# Patient Record
Sex: Female | Born: 1955 | Race: Black or African American | Hispanic: No | Marital: Married | Smoking: Never smoker
Health system: Southern US, Community
[De-identification: ages and names within clinical notes are randomized; demographics above are authoritative.]

## PROBLEM LIST (undated history)

## (undated) DIAGNOSIS — I10 Essential (primary) hypertension: Secondary | ICD-10-CM

## (undated) DIAGNOSIS — J309 Allergic rhinitis, unspecified: Secondary | ICD-10-CM

## (undated) DIAGNOSIS — R739 Hyperglycemia, unspecified: Secondary | ICD-10-CM

## (undated) DIAGNOSIS — M1990 Unspecified osteoarthritis, unspecified site: Secondary | ICD-10-CM

## (undated) DIAGNOSIS — B372 Candidiasis of skin and nail: Secondary | ICD-10-CM

## (undated) DIAGNOSIS — J069 Acute upper respiratory infection, unspecified: Secondary | ICD-10-CM

## (undated) DIAGNOSIS — I8390 Asymptomatic varicose veins of unspecified lower extremity: Secondary | ICD-10-CM

## (undated) DIAGNOSIS — J45909 Unspecified asthma, uncomplicated: Secondary | ICD-10-CM

## (undated) DIAGNOSIS — R198 Other specified symptoms and signs involving the digestive system and abdomen: Secondary | ICD-10-CM

## (undated) DIAGNOSIS — E785 Hyperlipidemia, unspecified: Secondary | ICD-10-CM

## (undated) DIAGNOSIS — S8991XA Unspecified injury of right lower leg, initial encounter: Secondary | ICD-10-CM

## (undated) DIAGNOSIS — E559 Vitamin D deficiency, unspecified: Secondary | ICD-10-CM

## (undated) DIAGNOSIS — M109 Gout, unspecified: Secondary | ICD-10-CM

## (undated) HISTORY — DX: Unspecified osteoarthritis, unspecified site: M19.90

## (undated) HISTORY — DX: Unspecified injury of right lower leg, initial encounter: S89.91XA

## (undated) HISTORY — DX: Allergic rhinitis, unspecified: J30.9

## (undated) HISTORY — DX: Hyperlipidemia, unspecified: E78.5

## (undated) HISTORY — DX: Asymptomatic varicose veins of unspecified lower extremity: I83.90

## (undated) HISTORY — DX: Acute upper respiratory infection, unspecified: J06.9

## (undated) HISTORY — DX: Gout, unspecified: M10.9

## (undated) HISTORY — DX: Other specified symptoms and signs involving the digestive system and abdomen: R19.8

## (undated) HISTORY — DX: Unspecified asthma, uncomplicated: J45.909

## (undated) HISTORY — DX: Vitamin D deficiency, unspecified: E55.9

## (undated) HISTORY — DX: Hyperglycemia, unspecified: R73.9

## (undated) HISTORY — DX: Candidiasis of skin and nail: B37.2

---

## 1988-10-19 HISTORY — PX: TUBAL LIGATION: SHX77

## 2016-07-14 LAB — BASIC METABOLIC PANEL
BUN: 12 (ref 4–21)
Creatinine: 0.9 (ref <=1.1)
Glucose: 98
Potassium: 5 (ref 3.4–5.3)
Sodium: 144 (ref 137–147)

## 2016-07-14 LAB — VITAMIN B12: Vitamin B-12: 275

## 2016-07-14 LAB — CBC AND DIFFERENTIAL: WBC: 4.3

## 2016-07-14 LAB — VITAMIN D 25 HYDROXY (VIT D DEFICIENCY, FRACTURES): Vit D, 25-Hydroxy: 32.7

## 2016-07-14 LAB — TSH: TSH: 4.11 (ref 0.41–5.90)

## 2017-04-08 LAB — VITAMIN D 25 HYDROXY (VIT D DEFICIENCY, FRACTURES): Vit D, 25-Hydroxy: 37.5

## 2017-04-08 LAB — VITAMIN B12: Vitamin B-12: 575

## 2018-05-24 LAB — VITAMIN D 25 HYDROXY (VIT D DEFICIENCY, FRACTURES): Vit D, 25-Hydroxy: 33.4

## 2019-01-02 LAB — HM MAMMOGRAPHY

## 2019-02-06 ENCOUNTER — Telehealth: Payer: Self-pay | Admitting: Family

## 2019-02-06 DIAGNOSIS — R52 Pain, unspecified: Secondary | ICD-10-CM

## 2019-02-06 DIAGNOSIS — H9209 Otalgia, unspecified ear: Secondary | ICD-10-CM

## 2019-02-06 DIAGNOSIS — R42 Dizziness and giddiness: Secondary | ICD-10-CM

## 2019-02-06 NOTE — Progress Notes (Signed)
Based on what you shared with me, I feel your condition warrants further evaluation and I recommend that you be seen for a face to face office visit.  Given your symptoms of dizziness, body aches, and nausea it would be best if you were seen face to face to be evaluated.    NOTE: If you entered your credit card information for this eVisit, you will not be charged. You may see a "hold" on your card for the $35 but that hold will drop off and you will not have a charge processed.  If you are having a true medical emergency please call 911.  If you need an urgent face to face visit, [place] has four urgent care centers for your convenience.    PLEASE NOTE: THE [facility] LOCATIONS AND URGENT CARE CLINICS DO NOT HAVE THE TESTING FOR CORONAVIRUS COVID19 AVAILABLE.  IF YOU FEEL YOU NEED THIS TEST YOU MUST GO TO A TRIAGE LOCATION AT ONE OF THE HOSPITAL EMERGENCY DEPARTMENTS   [email or web address] to reserve your spot online an avoid wait times  [facility] [address], [address] [place], [place] [address] Modified hours of operation: Monday-Friday, 10 AM to 6 PM  Saturday & Sunday 10 AM to 4 PM *Across the street from [employer]  [employer] (New Address!) [address], [address] [place], [place] [address] *Just off [employer], across the road from [place]* Modified hours of operation: Monday-Friday, 10 AM to 5 PM  Closed Saturday & Sunday   The following sites will take your insurance:  . [facility]  [phone] Get Driving Directions Find a Provider at this Location  [address] [place], [place] [address] . 10 am to 8 pm Monday-Friday . 12 pm to 8 pm Saturday-Sunday   . [facility] at [facility]  [phone] Get Driving Directions Find a Provider at this Location  [address] [place], [place] [address] . 8 am to 8 pm Monday-Friday . 9 am to 6 pm Saturday . 11  am to 6 pm Sunday   . [facility] at [facility]  [phone] Get Driving Directions  [address] [place], [place] [address] . 8 am to 8 pm Monday-Friday . 8 am to 4 pm Saturday-Sunday   Your e-visit answers were reviewed by a board certified advanced clinical practitioner to complete your personal care plan.  Thank you for using e-Visits.

## 2019-02-07 ENCOUNTER — Ambulatory Visit (HOSPITAL_COMMUNITY)
Admission: EM | Admit: 2019-02-07 | Discharge: 2019-02-07 | Disposition: A | Discharge status: Home / Self Care | Payer: Commercial Managed Care - PPO | Attending: Family Medicine | Admitting: Family Medicine

## 2019-02-07 ENCOUNTER — Encounter (HOSPITAL_COMMUNITY): Payer: Self-pay | Admitting: Family Medicine

## 2019-02-07 ENCOUNTER — Other Ambulatory Visit: Payer: Self-pay

## 2019-02-07 DIAGNOSIS — H6991 Unspecified Eustachian tube disorder, right ear: Secondary | ICD-10-CM

## 2019-02-07 DIAGNOSIS — H6981 Other specified disorders of Eustachian tube, right ear: Secondary | ICD-10-CM

## 2019-02-07 DIAGNOSIS — R42 Dizziness and giddiness: Secondary | ICD-10-CM | POA: Diagnosis not present

## 2019-02-07 DIAGNOSIS — H9201 Otalgia, right ear: Secondary | ICD-10-CM

## 2019-02-07 HISTORY — DX: Essential (primary) hypertension: I10

## 2019-02-07 MED ORDER — MECLIZINE HCL 25 MG PO TABS: 25.0000 mg | ORAL_TABLET | Freq: Three times a day (TID) | ORAL | 0 refills | Status: DC | PRN

## 2019-02-07 MED ORDER — FLUTICASONE PROPIONATE 50 MCG/ACT NA SUSP: 1.0000 | Freq: Every day | NASAL | 2 refills | Status: DC

## 2019-02-07 MED ORDER — ONDANSETRON 4 MG PO TBDP: 4.0000 mg | ORAL_TABLET | Freq: Three times a day (TID) | ORAL | 0 refills | Status: DC | PRN

## 2019-02-07 NOTE — Discharge Instructions (Addendum)
I believe that your dizziness is  related to eustachian tube dysfunction.  Eustachian tube dysfunction refers to a condition in which a blockage develops in the narrow passage that connects the middle ear to the back of the nose (eustachian tube). The eustachian tube regulates air pressure in the middle ear by letting air move between the ear and nose. It also helps to drain fluid from the middle ear space.  Common symptoms of this condition include: A feeling of fullness in the ear. Ear pain. Clicking or popping noises in the ear. Ringing in the ear. Hearing loss. Loss of balance. Dizziness.   We are treating this with Flonase, Claritin, Zofran for nausea as needed and meclizine for dizziness.  Follow up as needed for continued or worsening symptoms

## 2019-02-07 NOTE — ED Provider Notes (Signed)
[facility]    CSN: [id number] Arrival date & time: 02/07/19  0803     History   Chief Complaint Chief Complaint  Patient presents with  . Otalgia  . Nausea  . Dizziness    HPI Traci Gomez is a 64 y.o. female.   Patient is a 63 year old female with past medical history of asthma and hypertension.  She presents with approximately 5 days of right ear pain, nausea, intermittent dizziness when she moves her head to the right.  Symptoms have been constant but waxing and waning.  Denies any vomiting.  She does have a history of allergies.  She takes Singulair for allergies.  Denies any associated chest pain, shortness of breath, palpitations, cough, chest congestion, vision changes, weakness, slurred speech.  Gait has been mildly off due to the dizziness.  She has had vertigo in the past.  Reports she has been staying with her daughter and inside the house for approximately 2 weeks.  She has been under a lot of stress and had a lot of anxiety with the COVID situation.  No recent traveling, history of DVT or PE.  She does not smoke.  ROS per HPI      Past Medical History:  Diagnosis Date  . Hypertension     There are no active problems to display for this patient.   History reviewed. No pertinent surgical history.  OB History   No obstetric history on file.      Home Medications    Prior to Admission medications   Medication Sig Start Date End Date Taking? Authorizing Provider  fluticasone (FLONASE) 50 MCG/ACT nasal spray Place 1 spray into both nostrils daily. 02/07/19   Dahlia Byes A, NP  meclizine (ANTIVERT) 25 MG tablet Take 1 tablet (25 mg total) by mouth 3 (three) times daily as needed for dizziness. 02/07/19   Dahlia Byes A, NP  ondansetron (ZOFRAN ODT) 4 MG disintegrating tablet Take 1 tablet (4 mg total) by mouth every 8 (eight) hours as needed for nausea or vomiting. 02/07/19   Janace Aris, NP    Family History History reviewed. No pertinent family  history.  Social History Social History   Tobacco Use  . Smoking status: Never Smoker  Substance Use Topics  . Alcohol use: Not on file  . Drug use: Not on file     Allergies   Sulfa antibiotics   Review of Systems Review of Systems   Physical Exam Triage Vital Signs ED Triage Vitals  Enc Vitals Group     BP 02/07/19 0836 (!) 149/77     Pulse Rate 02/07/19 0836 (!) 106     Resp 02/07/19 0836 18     Temp 02/07/19 0836 98 F (36.7 C)     Temp Source 02/07/19 0836 Oral     SpO2 02/07/19 0836 97 %     Weight --      Height --      Head Circumference --      Peak Flow --      Pain Score 02/07/19 0838 6     Pain Loc --      Pain Edu? --      Excl. in GC? --    No data found.  Updated Vital Signs BP (!) 149/77 (BP Location: Right Arm) Comment: Reported BP to NP Derak Schurman  Pulse (!) 106 Comment: reported HR to provider NP Rye Decoste  Temp 98 F (36.7 C) (Oral)   Resp 18  SpO2 97%   Visual Acuity Right Eye Distance:   Left Eye Distance:   Bilateral Distance:    Right Eye Near:   Left Eye Near:    Bilateral Near:     Physical Exam Vitals signs and nursing note reviewed.  Constitutional:      General: She is not in acute distress.    Appearance: Normal appearance. She is not ill-appearing, toxic-appearing or diaphoretic.     Comments: Anxious and tearful    HENT:     Head: Normocephalic and atraumatic.     Right Ear: Tympanic membrane and ear canal normal.     Left Ear: Tympanic membrane and ear canal normal.     Nose: Nose normal.     Mouth/Throat:     Mouth: Mucous membranes are dry.     Pharynx: Oropharynx is clear.  Eyes:     General: No visual field deficit.    Extraocular Movements: Extraocular movements intact.     Conjunctiva/sclera: Conjunctivae normal.     Pupils: Pupils are equal, round, and reactive to light.  Neck:     Musculoskeletal: Normal range of motion.  Cardiovascular:     Rate and Rhythm: Normal rate and regular rhythm.      Pulses: Normal pulses.     Heart sounds: Normal heart sounds.  Pulmonary:     Effort: Pulmonary effort is normal.     Breath sounds: Normal breath sounds.  Musculoskeletal: Normal range of motion.  Skin:    General: Skin is warm and dry.  Neurological:     General: No focal deficit present.     Mental Status: She is alert and oriented to person, place, and time.     Cranial Nerves: Cranial nerves are intact. No cranial nerve deficit, dysarthria or facial asymmetry.     Sensory: Sensation is intact. No sensory deficit.     Motor: Motor function is intact. No weakness, tremor or abnormal muscle tone.     Coordination: Coordination is intact. Romberg sign negative. Coordination normal.     Gait: Gait normal.  Psychiatric:        Mood and Affect: Mood normal.      UC Treatments / Results  Labs (all labs ordered are listed, but only abnormal results are displayed) Labs Reviewed - No data to display  EKG None  Radiology No results found.  Procedures Procedures (including critical care time)  Medications Ordered in UC Medications - No data to display  Initial Impression / Assessment and Plan / UC Course  I have reviewed the triage vital signs and the nursing notes.  Pertinent labs & imaging results that were available during my care of the patient were reviewed by me and considered in my medical decision making (see chart for details).    Eustachian tube dysfunction caused by allergies  Patient is a 63 year old female who presents with ear pain, postnasal drip, intermittent dizziness. Symptoms consistent with eustachian tube dysfunction most likely caused by allergies Nausea most likely from post nasal drip. Her vital signs are stable and she is nontoxic or ill-appearing. No focal neuro deficits present. No concern for CVA.  Pt anxious and tearful We will treat her symptoms with Flonase nasal spray, Claritin and give her Zofran as needed for nausea and meclizine as  needed for dizziness Instructed that if her symptoms continue or worsen she will need to follow-up Patient understanding and agreeable to plan Final Clinical Impressions(s) / UC Diagnoses   Final diagnoses:  Dizziness  Right ear pain  Dysfunction of right eustachian tube     Discharge Instructions     I believe that your dizziness is  related to eustachian tube dysfunction.  Eustachian tube dysfunction refers to a condition in which a blockage develops in the narrow passage that connects the middle ear to the back of the nose (eustachian tube). The eustachian tube regulates air pressure in the middle ear by letting air move between the ear and nose. It also helps to drain fluid from the middle ear space.  Common symptoms of this condition include:  A feeling of fullness in the ear.  Ear pain.  Clicking or popping noises in the ear.  Ringing in the ear.  Hearing loss.  Loss of balance.  Dizziness.   We are treating this with Flonase, Claritin, Zofran for nausea as needed and meclizine for dizziness.  Follow up as needed for continued or worsening symptoms    ED Prescriptions    Medication Sig Dispense Auth. Provider   ondansetron (ZOFRAN ODT) 4 MG disintegrating tablet Take 1 tablet (4 mg total) by mouth every 8 (eight) hours as needed for nausea or vomiting. 20 tablet Watt Geiler A, NP   meclizine (ANTIVERT) 25 MG tablet Take 1 tablet (25 mg total) by mouth 3 (three) times daily as needed for dizziness. 30 tablet Jonanthan Bolender A, NP   fluticasone (FLONASE) 50 MCG/ACT nasal spray Place 1 spray into both nostrils daily. 16 g Dahlia ByesBast, Yochanan Eddleman A, NP     Controlled Substance Prescriptions [place] Controlled Substance Registry consulted? Not Applicable   Janace ArisBast, Langford Carias A, NP 02/07/19 [phone]

## 2019-02-07 NOTE — ED Triage Notes (Signed)
Pt presents with ear pain, nausea, dizziness when she moves her head a certain way.  Pt also has complaints of an inflammed varicose vein in her right leg.

## 2019-03-14 ENCOUNTER — Encounter: Payer: Self-pay | Admitting: Nurse Practitioner

## 2019-03-15 ENCOUNTER — Other Ambulatory Visit: Payer: Self-pay

## 2019-03-15 ENCOUNTER — Encounter: Payer: Self-pay | Admitting: Nurse Practitioner

## 2019-03-15 ENCOUNTER — Ambulatory Visit (INDEPENDENT_AMBULATORY_CARE_PROVIDER_SITE_OTHER): Payer: Commercial Managed Care - PPO | Admitting: Nurse Practitioner

## 2019-03-15 VITALS — BP 128/72 | HR 75 | Temp 98.0°F | Ht 69.5 in | Wt 331.8 lb

## 2019-03-15 DIAGNOSIS — K5904 Chronic idiopathic constipation: Secondary | ICD-10-CM

## 2019-03-15 DIAGNOSIS — R0602 Shortness of breath: Secondary | ICD-10-CM

## 2019-03-15 DIAGNOSIS — Z13220 Encounter for screening for lipoid disorders: Secondary | ICD-10-CM

## 2019-03-15 DIAGNOSIS — M1A0410 Idiopathic chronic gout, right hand, without tophus (tophi): Secondary | ICD-10-CM

## 2019-03-15 DIAGNOSIS — Z6841 Body Mass Index (BMI) 40.0 and over, adult: Secondary | ICD-10-CM

## 2019-03-15 DIAGNOSIS — I83893 Varicose veins of bilateral lower extremities with other complications: Secondary | ICD-10-CM

## 2019-03-15 DIAGNOSIS — I10 Essential (primary) hypertension: Secondary | ICD-10-CM

## 2019-03-15 NOTE — Patient Instructions (Addendum)
Follow up in 6-8 weeks for physical (will need PAP)  Make sure to signs record release for medical records  Okay to use tylenol 500 mg 1-2 tablets every 8 hours as needed pain  Pulmonary referral placed for further evaluation of shortness of breath  Increase activity as tolerates.    Low-Purine Eating Plan A low-purine eating plan involves making food choices to limit your intake of purine. Purine is a kind of uric acid. Too much uric acid in your blood can cause certain conditions, such as gout and kidney stones. Eating a low-purine diet can help control these conditions. What are tips for following this plan? Reading food labels   Avoid foods with saturated or Trans fat.  Check the ingredient list of grains-based foods, such as bread and cereal, to make sure that they contain whole grains.  Check the ingredient list of sauces or soups to make sure they do not contain meat or fish.  When choosing soft drinks, check the ingredient list to make sure they do not contain high-fructose corn syrup. Shopping  Buy plenty of fresh fruits and vegetables.  Avoid buying canned or fresh fish.  Buy dairy products labeled as low-fat or nonfat.  Avoid buying premade or processed foods. These foods are often high in fat, salt (sodium), and added sugar. Cooking  Use olive oil instead of butter when cooking. Oils like olive oil, canola oil, and sunflower oil contain healthy fats. Meal planning  Learn which foods do or do not affect you. If you find out that a food tends to cause your gout symptoms to flare up, avoid eating that food. You can enjoy foods that do not cause problems. If you have any questions about a food item, talk with your dietitian or health care provider.  Limit foods high in fat, especially saturated fat. Fat makes it harder for your body to get rid of uric acid.  Choose foods that are lower in fat and are lean sources of protein. General guidelines  Limit alcohol  intake to no more than 1 drink a day for nonpregnant women and 2 drinks a day for men. One drink equals 12 oz of beer, 5 oz of wine, or 1 oz of hard liquor. Alcohol can affect the way your body gets rid of uric acid.  Drink plenty of water to keep your urine clear or pale yellow. Fluids can help remove uric acid from your body.  If directed by your health care provider, take a vitamin C supplement.  Work with your health care provider and dietitian to develop a plan to achieve or maintain a healthy weight. Losing weight can help reduce uric acid in your blood. What foods are recommended? The items listed may not be a complete list. Talk with your dietitian about what dietary choices are best for you. Foods low in purines Foods low in purines do not need to be limited. These include:  All fruits.  All low-purine vegetables, pickles, and olives.  Breads, pasta, rice, cornbread, and popcorn. Cake and other baked goods.  All dairy foods.  Eggs, nuts, and nut butters.  Spices and condiments, such as salt, herbs, and vinegar.  Plant oils, butter, and margarine.  Water, sugar-free soft drinks, tea, coffee, and cocoa.  Vegetable-based soups, broths, sauces, and gravies. Foods moderate in purines Foods moderate in purines should be limited to the amounts listed.   cup of asparagus, cauliflower, spinach, mushrooms, or green peas, each day.  2/3 cup uncooked oatmeal, each  day.   cup dry wheat bran or wheat germ, each day.  2-3 ounces of meat or poultry, each day.  4-6 ounces of shellfish, such as crab, lobster, oysters, or shrimp, each day.  1 cup cooked beans, peas, or lentils, each day.  Soup, broths, or bouillon made from meat or fish. Limit these foods as much as possible. What foods are not recommended? The items listed may not be a complete list. Talk with your dietitian about what dietary choices are best for you. Limit your intake of foods high in purines, including:   Beer and other alcohol.  Meat-based gravy or sauce.  Canned or fresh fish, such as: ? Anchovies, sardines, herring, and tuna. ? Mussels and scallops. ? Codfish, trout, and haddock.  Tomasa BlaseBacon.  Organ meats, such as: ? Liver or kidney. ? Tripe. ? Sweetbreads (thymus gland or pancreas).  Wild Education officer, environmentalgame or goose.  Yeast or yeast extract supplements.  Drinks sweetened with high-fructose corn syrup. Summary  Eating a low-purine diet can help control conditions caused by too much uric acid in the body, such as gout or kidney stones.  Choose low-purine foods, limit alcohol, and limit foods high in fat.  You will learn over time which foods do or do not affect you. If you find out that a food tends to cause your gout symptoms to flare up, avoid eating that food. This information is not intended to replace advice given to you by your health care provider. Make sure you discuss any questions you have with your health care provider. Document Released: 01/30/2011 Document Revised: 11/18/2016 Document Reviewed: 11/18/2016 Elsevier Interactive Patient Education  2019 [employer] Inc[employer]     Fat and Cholesterol Restricted Eating Plan Getting too much fat and cholesterol in your diet may cause health problems. Choosing the right foods helps keep your fat and cholesterol at normal levels. This can keep you from getting certain diseases.  What are tips for following this plan? Meal planning  At meals, divide your plate into four equal parts: ? Fill one-half of your plate with vegetables and green salads. ? Fill one-fourth of your plate with whole grains. ? Fill one-fourth of your plate with low-fat (lean) protein foods.  Eat fish that is high in omega-3 fats at least two times a week. This includes mackerel, tuna, sardines, and salmon.  Eat foods that are high in fiber, such as whole grains, beans, apples, broccoli, carrots, peas, and barley. General tips   Work with your doctor to lose weight if  you need to.  Avoid: ? Foods with added sugar. ? Fried foods. ? Foods with partially hydrogenated oils.  Limit alcohol intake to no more than 1 drink a day for nonpregnant women and 2 drinks a day for men. One drink equals 12 oz of beer, 5 oz of wine, or 1 oz of hard liquor. Reading food labels  Check food labels for: ? Trans fats. ? Partially hydrogenated oils. ? Saturated fat (g) in each serving. ? Cholesterol (mg) in each serving. ? Fiber (g) in each serving.  Choose foods with healthy fats, such as: ? Monounsaturated fats. ? Polyunsaturated fats. ? Omega-3 fats.  Choose grain products that have whole grains. Look for the word "whole" as the first word in the ingredient list. Cooking  Cook foods using low-fat methods. These include baking, boiling, grilling, and broiling.  Eat more home-cooked foods. Eat at restaurants and buffets less often.  Avoid cooking using saturated fats, such as butter, cream, palm  oil, palm kernel oil, and coconut oil. Recommended foods  Fruits  All fresh, canned (in natural juice), or frozen fruits. Vegetables  Fresh or frozen vegetables (raw, steamed, roasted, or grilled). Green salads. Grains  Whole grains, such as whole wheat or whole grain breads, crackers, cereals, and pasta. Unsweetened oatmeal, bulgur, barley, quinoa, or brown rice. Corn or whole wheat flour tortillas. Meats and other protein foods  Ground beef (85% or leaner), grass-fed beef, or beef trimmed of fat. Skinless chicken or [place]. Ground chicken or [place]. Pork trimmed of fat. All fish and seafood. Egg whites. Dried beans, peas, or lentils. Unsalted nuts or seeds. Unsalted canned beans. Nut butters without added sugar or oil. Dairy  Low-fat or nonfat dairy products, such as skim or 1% milk, 2% or reduced-fat cheeses, low-fat and fat-free ricotta or cottage cheese, or plain low-fat and nonfat yogurt. Fats and oils  Tub margarine without trans fats. Light or  reduced-fat mayonnaise and salad dressings. Avocado. Olive, canola, sesame, or safflower oils. The items listed above may not be a complete list of foods and beverages you can eat. Contact a dietitian for more information. Foods to avoid Fruits  Canned fruit in heavy syrup. Fruit in cream or butter sauce. Fried fruit. Vegetables  Vegetables cooked in cheese, cream, or butter sauce. Fried vegetables. Grains  White bread. White pasta. White rice. Cornbread. Bagels, pastries, and croissants. Crackers and snack foods that contain trans fat and hydrogenated oils. Meats and other protein foods  Fatty cuts of meat. Ribs, chicken wings, bacon, sausage, bologna, salami, chitterlings, fatback, hot dogs, bratwurst, and packaged lunch meats. Liver and organ meats. Whole eggs and egg yolks. Chicken and [place] with skin. Fried meat. Dairy  Whole or 2% milk, cream, half-and-half, and cream cheese. Whole milk cheeses. Whole-fat or sweetened yogurt. Full-fat cheeses. Nondairy creamers and whipped toppings. Processed cheese, cheese spreads, and cheese curds. Beverages  Alcohol. Sugar-sweetened drinks such as sodas, lemonade, and fruit drinks. Fats and oils  Butter, stick margarine, lard, shortening, ghee, or bacon fat. Coconut, palm kernel, and palm oils. Sweets and desserts  Corn syrup, sugars, honey, and molasses. Candy. Jam and jelly. Syrup. Sweetened cereals. Cookies, pies, cakes, donuts, muffins, and ice cream. The items listed above may not be a complete list of foods and beverages you should avoid. Contact a dietitian for more information. Summary  Choosing the right foods helps keep your fat and cholesterol at normal levels. This can keep you from getting certain diseases.  At meals, fill one-half of your plate with vegetables and green salads.  Eat high-fiber foods, like whole grains, beans, apples, carrots, peas, and barley.  Limit added sugar, saturated fats, alcohol, and fried foods.  This information is not intended to replace advice given to you by your health care provider. Make sure you discuss any questions you have with your health care provider. Document Released: 04/05/2012 Document Revised: 06/08/2018 Document Reviewed: 06/22/2017 Elsevier Interactive Patient Education  2019 [employer].

## 2019-03-15 NOTE — Progress Notes (Signed)
Careteam: Patient Care Team: Sharon SellerEubanks, Lane Kjos K, NP as PCP - General (Geriatric Medicine)  Advanced Directive information Does Patient Have a Medical Advance Directive?: No, Would patient like information on creating a medical advance directive?: Yes (MAU/Ambulatory/Procedural Areas - Information given)  Allergies  Allergen Reactions  . Bactrim [Sulfamethoxazole-Trimethoprim]   . Sulfa Antibiotics     Chief Complaint  Patient presents with  . Establish Care    New patient establish care. Examine right knee. Right hand concerns.   . Advanced Directive    Advance Care Planning      HPI: Patient is a 63 y.o. female seen in the office today to establish care.  Husband comes to practice and her daughter wants her to be seen here.  She lives in [place] but been her with daughter for 6 months. Been here more than she has been at home.   Last saw her doctor in January. Unsure when her last physical was. Does not have GYN. Last PAP greater than 3 years.   Has not eaten this morning.   Gout/OA- takes allopurinol 100 mg daily, currently sore. Has pain to right hand  Hypertension- on amlodipine 5 mg daily, lasix 40 mg 1.5 mg daily with potassium, metoprolol succinate 25 mg daily, olmesartan 20 mg daily   Asthma- using albuterol neb and inhaler- when she is down in [place] she uses this TID routinely and then will use neb as needed (needed twice while she herein the last 3 months) when in [place] she does not need it but maybe twice a month. Unsure when or how she got diagnosised with asthma. [place] and the doctor there said she had asthma like symptoms and placed her on albuterol. She has been on this for several years. Also taking Singulair daily   Varicose veins- painful, hurt like a tooth ache. Tylenol controls pain.   constipation uses probiotic and yogurt, if she does not have this she does not go to the bathroom. If she takes she is regular.   Yeast in skin  folds- using nystatin powder which controls symptoms.   horrified of needles.   [place] Been out of work since March but plans to go back part time.   Review of Systems:  Review of Systems  Constitutional: Negative for chills, fever and weight loss.  HENT: Negative for tinnitus.   Eyes:       Itchy watery eyes around pollen season  Respiratory: Positive for shortness of breath. Negative for cough, sputum production and wheezing.   Cardiovascular: Negative for chest pain, palpitations and leg swelling.  Gastrointestinal: Positive for constipation (controlled on probiotic and yogurt). Negative for abdominal pain, diarrhea and heartburn.  Genitourinary: Negative for dysuria, frequency and urgency.  Musculoskeletal: Positive for joint pain (right hand). Negative for back pain, falls and myalgias.  Skin: Negative.   Neurological: Negative for dizziness and headaches.  Endo/Heme/Allergies: Positive for environmental allergies.  Psychiatric/Behavioral: Negative for depression and memory loss. The patient does not have insomnia.     Past Medical History:  Diagnosis Date  . Arthritis    Per [facility] new patient packet   . Asthma    Per [facility] new patient packet   . Hypertension   . Irregular bowel habits    Per [facility] new patient packet   . Right knee injury    Stretched veins in right knee   . Varicose vein of leg  Right, Per [facility] new patient packet    Past Surgical History:  Procedure Laterality Date  . TUBAL LIGATION  1990   [facility]    Social History:   reports that she has never smoked. She has never used smokeless tobacco. She reports previous alcohol use. She reports previous drug use.  Family History  Problem Relation Age of Onset  . High blood pressure Mother     Medications: Patient's Medications  New Prescriptions   No medications on file  Previous Medications   ACETAMINOPHEN (TYLENOL) 500 MG TABLET    Take 500 mg  by mouth as needed.   ALBUTEROL (VENTOLIN HFA) 108 (90 BASE) MCG/ACT INHALER    Inhale 2 puffs into the lungs every 6 (six) hours as needed for wheezing or shortness of breath.    ALBUTEROL SULFATE 2.5 MG/0.5ML NEBU    Inhale into the lungs as needed.   ALLOPURINOL (ZYLOPRIM) 100 MG TABLET    Take 100 mg by mouth daily.   AMLODIPINE (NORVASC) 5 MG TABLET    Take 5 mg by mouth daily.   AZELASTINE (OPTIVAR) 0.05 % OPHTHALMIC SOLUTION    Place 1 drop into both eyes 2 (two) times daily.   CLOTRIMAZOLE-BETAMETHASONE (LOTRISONE) CREAM    Apply 1 application topically as needed. To stomach area   ERGOCALCIFEROL (VITAMIN D2) 1.25 MG (50000 UT) CAPSULE    Take 50,000 Units by mouth 2 (two) times a week.   FLUTICASONE (FLONASE) 50 MCG/ACT NASAL SPRAY    Place 1 spray into both nostrils daily.   FUROSEMIDE (LASIX) 40 MG TABLET    Take 1 and 1/2 tablet by mouth daily   METOPROLOL SUCCINATE (TOPROL-XL) 25 MG 24 HR TABLET    Take 25 mg by mouth daily.   MONTELUKAST (SINGULAIR) 10 MG TABLET    Take 10 mg by mouth at bedtime.   NYSTATIN (MYCOSTATIN/NYSTOP) POWDER    Apply topically as needed.   OLMESARTAN (BENICAR) 20 MG TABLET    Take 20 mg by mouth daily.   POTASSIUM CHLORIDE (MICRO-K) 10 MEQ CR CAPSULE    Take 10 mEq by mouth daily.   TOBRAMYCIN-DEXAMETHASONE (TOBRADEX) OPHTHALMIC OINTMENT    Place 1 application into the left eye 2 (two) times daily.  Modified Medications   No medications on file  Discontinued Medications   MECLIZINE (ANTIVERT) 25 MG TABLET    Take 1 tablet (25 mg total) by mouth 3 (three) times daily as needed for dizziness.   ONDANSETRON (ZOFRAN ODT) 4 MG DISINTEGRATING TABLET    Take 1 tablet (4 mg total) by mouth every 8 (eight) hours as needed for nausea or vomiting.   POTASSIUM CHLORIDE (K-DUR) 10 MEQ TABLET    Take 10 mEq by mouth daily.     Physical Exam:  Vitals:   03/15/19 0832  BP: 128/72  Pulse: 75  Temp: 98 F (36.7 C)  TempSrc: Oral  SpO2: 98%  Weight: (!) 331 lb  12.8 oz (150.5 kg)  Height: 5' 9.5" (1.765 m)   Body mass index is 48.3 kg/m.  Physical Exam Constitutional:      General: She is not in acute distress.    Appearance: She is well-developed. She is not diaphoretic.  HENT:     Head: Normocephalic and atraumatic.     Mouth/Throat:     Pharynx: No oropharyngeal exudate.  Eyes:     Conjunctiva/sclera: Conjunctivae normal.     Pupils: Pupils are equal, round, and reactive to light.  Neck:  Musculoskeletal: Normal range of motion and neck supple.  Cardiovascular:     Rate and Rhythm: Normal rate and regular rhythm.     Heart sounds: Normal heart sounds.  Pulmonary:     Effort: Pulmonary effort is normal.     Breath sounds: Normal breath sounds.  Abdominal:     General: Bowel sounds are normal.     Palpations: Abdomen is soft.  Musculoskeletal:        General: No swelling or tenderness.     Right knee: She exhibits normal range of motion, no swelling and no effusion.     Comments: crepitus on exam. Varicosities noted around knee   Skin:    General: Skin is warm and dry.  Neurological:     Mental Status: She is alert and oriented to person, place, and time.     Labs reviewed: Basic Metabolic Panel: No results for input(s): NA, K, CL, CO2, GLUCOSE, BUN, CREATININE, CALCIUM, MG, PHOS, TSH in the last 8760 hours. Liver Function Tests: No results for input(s): AST, ALT, ALKPHOS, BILITOT, PROT, ALBUMIN in the last 8760 hours. No results for input(s): LIPASE, AMYLASE in the last 8760 hours. No results for input(s): AMMONIA in the last 8760 hours. CBC: No results for input(s): WBC, NEUTROABS, HGB, HCT, MCV, PLT in the last 8760 hours. Lipid Panel: No results for input(s): CHOL, HDL, LDLCALC, TRIG, CHOLHDL, LDLDIRECT in the last 8760 hours. TSH: No results for input(s): TSH in the last 8760 hours. A1C: No results found for: HGBA1C   Assessment/Plan 1. Shortness of breath -was told in the past she had "asthma like  symptoms" and give albuterol but reports she has had no formal testing or evaluation due to shortness of breath. Hx of working near [employer] for many years and smoke exposure as a child  - Ambulatory referral to Pulmonology  2. Chronic gout of right hand, unspecified cause -ongoing ache worse depending on diet. Encouraged low purine diet. Information given. Continues on allopurinol. Will follow up lab.  - Uric acid  3. Essential hypertension -controlled on current regimen.  - COMPLETE METABOLIC PANEL WITH GFR - CBC with Differential/Platelet  4. Screening for lipid disorders - Lipid Panel - COMPLETE METABOLIC PANEL WITH GFR  5. Obesity, morbid (more than 100 lbs over ideal weight or BMI > 40) (HCC) Noted today, discussed weight loss with diet and exercise.  - Lipid Panel  6. Body mass index (BMI) of 45.0-49.9 in adult [facility]) -noted today, discussed weight loss with diet and exericse.   7. Varicose veins of both legs with edema -varicose veins causing discomfort but tylenol controls pain. Continue on tylenol PRN. Edema stable on lasix 40 mg 1.5 mg daily with potassium   8. Chronic idiopathic constipation Controlled with probiotic and yogurt.   Next appt: 6-8 weeks for CPE and PAP Shaquira Moroz K. Biagio Borg  [facility] [phone]

## 2019-03-18 LAB — COMPLETE METABOLIC PANEL WITH GFR
AG Ratio: 1.4 (calc) (ref 1.0–2.5)
ALT: 14 U/L (ref 6–29)
AST: 19 U/L (ref 10–35)
Albumin: 4.2 g/dL (ref 3.6–5.1)
Alkaline phosphatase (APISO): 76 U/L (ref 37–153)
BUN: 19 mg/dL (ref 7–25)
CO2: 29 mmol/L (ref 20–32)
Calcium: 9.7 mg/dL (ref 8.6–10.4)
Chloride: 103 mmol/L (ref 98–110)
Creat: 0.94 mg/dL (ref 0.50–0.99)
GFR, Est African American: 75 mL/min/{1.73_m2} (ref >=60)
GFR, Est Non African American: 65 mL/min/{1.73_m2} (ref >=60)
Globulin: 3 g/dL (calc) (ref 1.9–3.7)
Glucose, Bld: 110 mg/dL — ABNORMAL HIGH (ref 65–99)
Potassium: 4.9 mmol/L (ref 3.5–5.3)
Sodium: 140 mmol/L (ref 135–146)
Total Bilirubin: 0.8 mg/dL (ref 0.2–1.2)
Total Protein: 7.2 g/dL (ref 6.1–8.1)

## 2019-03-18 LAB — LIPID PANEL
Cholesterol: 172 mg/dL (ref <200)
HDL: 58 mg/dL (ref >=50)
LDL Cholesterol (Calc): 94 mg/dL (calc)
Non-HDL Cholesterol (Calc): 114 mg/dL (calc) (ref <130)
Total CHOL/HDL Ratio: 3 (calc) (ref <5.0)
Triglycerides: 106 mg/dL (ref <150)

## 2019-03-18 LAB — CBC WITH DIFFERENTIAL/PLATELET
Absolute Monocytes: 460 cells/uL (ref 200–950)
Basophils Absolute: 30 cells/uL (ref 0–200)
Basophils Relative: 0.8 %
Eosinophils Absolute: 68 cells/uL (ref 15–500)
Eosinophils Relative: 1.8 %
HCT: 43.1 % (ref 35.0–45.0)
Hemoglobin: 14.4 g/dL (ref 11.7–15.5)
Lymphs Abs: 1820 cells/uL (ref 850–3900)
MCH: 27.6 pg (ref 27.0–33.0)
MCHC: 33.4 g/dL (ref 32.0–36.0)
MCV: 82.7 fL (ref 80.0–100.0)
MPV: 11.2 fL (ref 7.5–12.5)
Monocytes Relative: 12.1 %
Neutro Abs: 1421 cells/uL — ABNORMAL LOW (ref 1500–7800)
Neutrophils Relative %: 37.4 %
Platelets: 261 10*3/uL (ref 140–400)
RBC: 5.21 10*6/uL — ABNORMAL HIGH (ref 3.80–5.10)
RDW: 13.1 % (ref 11.0–15.0)
Total Lymphocyte: 47.9 %
WBC: 3.8 10*3/uL (ref 3.8–10.8)

## 2019-03-18 LAB — HEMOGLOBIN A1C
Hgb A1c MFr Bld: 5.9 % of total Hgb — ABNORMAL HIGH (ref <5.7)
Mean Plasma Glucose: 123 (calc)
eAG (mmol/L): 6.8 (calc)

## 2019-03-18 LAB — URIC ACID: Uric Acid, Serum: 5.5 mg/dL (ref 2.5–7.0)

## 2019-03-18 LAB — TEST AUTHORIZATION

## 2019-04-09 ENCOUNTER — Encounter: Payer: Self-pay | Admitting: Nurse Practitioner

## 2019-04-10 MED ORDER — AMLODIPINE BESYLATE 5 MG PO TABS: 5.0000 mg | ORAL_TABLET | Freq: Every day | ORAL | 0 refills | Status: DC

## 2019-04-10 MED ORDER — ERGOCALCIFEROL 1.25 MG (50000 UT) PO CAPS: 50000.0000 [IU] | ORAL_CAPSULE | ORAL | 0 refills | Status: DC

## 2019-04-10 MED ORDER — METOPROLOL SUCCINATE ER 25 MG PO TB24: 25.0000 mg | ORAL_TABLET | Freq: Every day | ORAL | 0 refills | Status: DC

## 2019-04-10 MED ORDER — OLMESARTAN MEDOXOMIL 20 MG PO TABS: 20.0000 mg | ORAL_TABLET | Freq: Every day | ORAL | 0 refills | Status: DC

## 2019-04-28 ENCOUNTER — Encounter: Payer: Self-pay | Admitting: Nurse Practitioner

## 2019-05-01 MED ORDER — MONTELUKAST SODIUM 10 MG PO TABS: 10.0000 mg | ORAL_TABLET | Freq: Every day | ORAL | 0 refills | Status: DC

## 2019-05-01 MED ORDER — FUROSEMIDE 40 MG PO TABS: ORAL_TABLET | ORAL | 0 refills | Status: DC

## 2019-05-01 MED ORDER — ALLOPURINOL 100 MG PO TABS: 100.0000 mg | ORAL_TABLET | Freq: Every day | ORAL | 0 refills | Status: DC

## 2019-05-01 MED ORDER — POTASSIUM CHLORIDE ER 10 MEQ PO CPCR: 10.0000 meq | ORAL_CAPSULE | Freq: Every day | ORAL | 0 refills | Status: DC

## 2019-05-01 MED ORDER — ALBUTEROL SULFATE HFA 108 (90 BASE) MCG/ACT IN AERS: 2.0000 | INHALATION_SPRAY | Freq: Four times a day (QID) | RESPIRATORY_TRACT | 0 refills | Status: DC | PRN

## 2019-05-04 LAB — HM PAP SMEAR: HM Pap smear: NORMAL

## 2019-05-05 NOTE — Telephone Encounter (Signed)
RX was sent on 05/01/2019 for albuterol inhaler

## 2019-05-10 DIAGNOSIS — J309 Allergic rhinitis, unspecified: Secondary | ICD-10-CM | POA: Insufficient documentation

## 2019-05-12 ENCOUNTER — Encounter: Payer: Commercial Managed Care - PPO | Admitting: Nurse Practitioner

## 2019-05-15 ENCOUNTER — Institutional Professional Consult (permissible substitution): Payer: Commercial Managed Care - PPO | Admitting: Internal Medicine

## 2019-05-23 ENCOUNTER — Institutional Professional Consult (permissible substitution): Payer: Commercial Managed Care - PPO | Admitting: Internal Medicine

## 2019-06-02 ENCOUNTER — Ambulatory Visit: Payer: Commercial Managed Care - PPO | Admitting: Nurse Practitioner

## 2019-06-02 ENCOUNTER — Encounter: Payer: Self-pay | Admitting: Nurse Practitioner

## 2019-06-02 ENCOUNTER — Encounter: Payer: Self-pay | Admitting: Internal Medicine

## 2019-06-02 ENCOUNTER — Ambulatory Visit (INDEPENDENT_AMBULATORY_CARE_PROVIDER_SITE_OTHER): Payer: Commercial Managed Care - PPO

## 2019-06-02 ENCOUNTER — Other Ambulatory Visit: Payer: Self-pay

## 2019-06-02 ENCOUNTER — Ambulatory Visit: Payer: Commercial Managed Care - PPO | Admitting: Internal Medicine

## 2019-06-02 VITALS — BP 140/82 | HR 76 | Temp 98.0°F | Ht 70.0 in | Wt 313.0 lb

## 2019-06-02 DIAGNOSIS — E785 Hyperlipidemia, unspecified: Secondary | ICD-10-CM | POA: Insufficient documentation

## 2019-06-02 DIAGNOSIS — I10 Essential (primary) hypertension: Secondary | ICD-10-CM

## 2019-06-02 DIAGNOSIS — I83893 Varicose veins of bilateral lower extremities with other complications: Secondary | ICD-10-CM

## 2019-06-02 DIAGNOSIS — Z0000 Encounter for general adult medical examination without abnormal findings: Secondary | ICD-10-CM | POA: Diagnosis not present

## 2019-06-02 DIAGNOSIS — J45991 Cough variant asthma: Secondary | ICD-10-CM | POA: Insufficient documentation

## 2019-06-02 DIAGNOSIS — R739 Hyperglycemia, unspecified: Secondary | ICD-10-CM | POA: Insufficient documentation

## 2019-06-02 DIAGNOSIS — E559 Vitamin D deficiency, unspecified: Secondary | ICD-10-CM | POA: Insufficient documentation

## 2019-06-02 DIAGNOSIS — R0602 Shortness of breath: Secondary | ICD-10-CM

## 2019-06-02 DIAGNOSIS — J309 Allergic rhinitis, unspecified: Secondary | ICD-10-CM

## 2019-06-02 DIAGNOSIS — M109 Gout, unspecified: Secondary | ICD-10-CM | POA: Insufficient documentation

## 2019-06-02 DIAGNOSIS — M1A0410 Idiopathic chronic gout, right hand, without tophus (tophi): Secondary | ICD-10-CM

## 2019-06-02 DIAGNOSIS — M1990 Unspecified osteoarthritis, unspecified site: Secondary | ICD-10-CM | POA: Insufficient documentation

## 2019-06-02 MED ORDER — FLUTICASONE PROPIONATE 50 MCG/ACT NA SUSP: 1.0000 | Freq: Every day | NASAL | 5 refills | Status: DC

## 2019-06-02 MED ORDER — ALLOPURINOL 100 MG PO TABS: 100.0000 mg | ORAL_TABLET | Freq: Every day | ORAL | 1 refills | Status: DC

## 2019-06-02 MED ORDER — PANTOPRAZOLE SODIUM 40 MG PO TBEC: 40.0000 mg | DELAYED_RELEASE_TABLET | Freq: Every day | ORAL | 2 refills | Status: DC

## 2019-06-02 MED ORDER — ERGOCALCIFEROL 1.25 MG (50000 UT) PO CAPS: 50000.0000 [IU] | ORAL_CAPSULE | ORAL | 1 refills | Status: DC

## 2019-06-02 MED ORDER — BUDESONIDE-FORMOTEROL FUMARATE 80-4.5 MCG/ACT IN AERO: 2.0000 | INHALATION_SPRAY | Freq: Two times a day (BID) | RESPIRATORY_TRACT | 0 refills | Status: DC

## 2019-06-02 MED ORDER — FAMOTIDINE 20 MG PO TABS: ORAL_TABLET | ORAL | 11 refills | Status: DC

## 2019-06-02 MED ORDER — PREDNISONE 10 MG PO TABS: ORAL_TABLET | ORAL | 0 refills | Status: DC

## 2019-06-02 MED ORDER — OLMESARTAN MEDOXOMIL 20 MG PO TABS: 20.0000 mg | ORAL_TABLET | Freq: Every day | ORAL | 1 refills | Status: DC

## 2019-06-02 MED ORDER — FUROSEMIDE 40 MG PO TABS: ORAL_TABLET | ORAL | 1 refills | Status: DC

## 2019-06-02 MED ORDER — POTASSIUM CHLORIDE ER 10 MEQ PO CPCR: 10.0000 meq | ORAL_CAPSULE | Freq: Every day | ORAL | 1 refills | Status: DC

## 2019-06-02 MED ORDER — BUDESONIDE-FORMOTEROL FUMARATE 80-4.5 MCG/ACT IN AERO: 2.0000 | INHALATION_SPRAY | Freq: Two times a day (BID) | RESPIRATORY_TRACT | 11 refills | Status: DC

## 2019-06-02 MED ORDER — METOPROLOL SUCCINATE ER 25 MG PO TB24: 25.0000 mg | ORAL_TABLET | Freq: Every day | ORAL | 1 refills | Status: DC

## 2019-06-02 MED ORDER — AMLODIPINE BESYLATE 5 MG PO TABS: 5.0000 mg | ORAL_TABLET | Freq: Every day | ORAL | 1 refills | Status: DC

## 2019-06-02 NOTE — Patient Instructions (Signed)
KEEP UP THE GOOD WORK with diet and exercise  Follow up in 6 months with lab work before visit   Health Maintenance, Female Adopting a healthy lifestyle and getting preventive care are important in promoting health and wellness. Ask your health care provider about:  The right schedule for you to have regular tests and exams.  Things you can do on your own to prevent diseases and keep yourself healthy. What should I know about diet, weight, and exercise? Eat a healthy diet   Eat a diet that includes plenty of vegetables, fruits, low-fat dairy products, and lean protein.  Do not eat a lot of foods that are high in solid fats, added sugars, or sodium. Maintain a healthy weight Body mass index (BMI) is used to identify weight problems. It estimates body fat based on height and weight. Your health care provider can help determine your BMI and help you achieve or maintain a healthy weight. Get regular exercise Get regular exercise. This is one of the most important things you can do for your health. Most adults should:  Exercise for at least 150 minutes each week. The exercise should increase your heart rate and make you sweat (moderate-intensity exercise).  Do strengthening exercises at least twice a week. This is in addition to the moderate-intensity exercise.  Spend less time sitting. Even light physical activity can be beneficial. Watch cholesterol and blood lipids Have your blood tested for lipids and cholesterol at 64 years of age, then have this test every 5 years. Have your cholesterol levels checked more often if:  Your lipid or cholesterol levels are high.  You are older than 64 years of age.  You are at high risk for heart disease. What should I know about cancer screening? Depending on your health history and family history, you may need to have cancer screening at various ages. This may include screening for:  Breast cancer.  Cervical cancer.  Colorectal cancer.   Skin cancer.  Lung cancer. What should I know about heart disease, diabetes, and high blood pressure? Blood pressure and heart disease  High blood pressure causes heart disease and increases the risk of stroke. This is more likely to develop in people who have high blood pressure readings, are of African descent, or are overweight.  Have your blood pressure checked: ? Every 3-5 years if you are 5318-64 years of age. ? Every year if you are 64 years old or older. Diabetes Have regular diabetes screenings. This checks your fasting blood sugar level. Have the screening done:  Once every three years after age 64 if you are at a normal weight and have a low risk for diabetes.  More often and at a younger age if you are overweight or have a high risk for diabetes. What should I know about preventing infection? Hepatitis B If you have a higher risk for hepatitis B, you should be screened for this virus. Talk with your health care provider to find out if you are at risk for hepatitis B infection. Hepatitis C Testing is recommended for:  Everyone born from [id number] through 1965.  Anyone with known risk factors for hepatitis C. Sexually transmitted infections (STIs)  Get screened for STIs, including gonorrhea and chlamydia, if: ? You are sexually active and are younger than 64 years of age. ? You are older than 64 years of age and your health care provider tells you that you are at risk for this type of infection. ? Your sexual activity has  changed since you were last screened, and you are at increased risk for chlamydia or gonorrhea. Ask your health care provider if you are at risk.  Ask your health care provider about whether you are at high risk for HIV. Your health care provider may recommend a prescription medicine to help prevent HIV infection. If you choose to take medicine to prevent HIV, you should first get tested for HIV. You should then be tested every 3 months for as long as you are  taking the medicine. Pregnancy  If you are about to stop having your period (premenopausal) and you may become pregnant, seek counseling before you get pregnant.  Take 400 to 800 micrograms (mcg) of folic acid every day if you become pregnant.  Ask for birth control (contraception) if you want to prevent pregnancy. Osteoporosis and menopause Osteoporosis is a disease in which the bones lose minerals and strength with aging. This can result in bone fractures. If you are 115 years old or older, or if you are at risk for osteoporosis and fractures, ask your health care provider if you should:  Be screened for bone loss.  Take a calcium or vitamin D supplement to lower your risk of fractures.  Be given hormone replacement therapy (HRT) to treat symptoms of menopause. Follow these instructions at home: Lifestyle  Do not use any products that contain nicotine or tobacco, such as cigarettes, e-cigarettes, and chewing tobacco. If you need help quitting, ask your health care provider.  Do not use street drugs.  Do not share needles.  Ask your health care provider for help if you need support or information about quitting drugs. Alcohol use  Do not drink alcohol if: ? Your health care provider tells you not to drink. ? You are pregnant, may be pregnant, or are planning to become pregnant.  If you drink alcohol: ? Limit how much you use to 0-1 drink a day. ? Limit intake if you are breastfeeding.  Be aware of how much alcohol is in your drink. In the [place] one drink equals one 12 oz bottle of beer (355 mL), one 5 oz glass of wine (148 mL), or one 1 oz glass of hard liquor (44 mL). General instructions  Schedule regular health, dental, and eye exams.  Stay current with your vaccines.  Tell your health care provider if: ? You often feel depressed. ? You have ever been abused or do not feel safe at home. Summary  Adopting a healthy lifestyle and getting preventive care are important  in promoting health and wellness.  Follow your health care provider's instructions about healthy diet, exercising, and getting tested or screened for diseases.  Follow your health care provider's instructions on monitoring your cholesterol and blood pressure. This information is not intended to replace advice given to you by your health care provider. Make sure you discuss any questions you have with your health care provider. Document Released: 04/20/2011 Document Revised: 09/28/2018 Document Reviewed: 09/28/2018 Elsevier Patient Education  2020 [employer]     Get These Tests  Blood Pressure- Have your blood pressure checked by your healthcare provider at least once a year.  Normal blood pressure is 120/80. Goal for most <140/90.  Weight- Have your body mass index (BMI) calculated to screen for obesity.  BMI is a measure of body fat based on height and weight.  You can calculate your own BMI at https://www.west-esparza.com/www.nhlbisupport.com/bmi/  Cholesterol- Have your cholesterol checked every year.  Diabetes- Have your blood sugar checked  every year if you have high blood pressure, high cholesterol, a family history of diabetes or if you are overweight.  Pap Test - Have a pap test every 1 to 5 years if you have been sexually active.  If you are older than 65 and recent pap tests have been normal you may not need additional pap tests.  In addition, if you have had a hysterectomy  for benign disease additional pap tests are not necessary.  Mammogram-Yearly mammograms are essential for early detection of breast cancer  Screening for Colon Cancer- Colonoscopy starting at age 9. Screening may begin sooner depending on your family history and other health conditions.  Follow up colonoscopy as directed by your Gastroenterologist.  Screening for Osteoporosis- Screening begins at age 65 with bone density scanning, sooner if you are at higher risk for developing Osteoporosis.   Get these medicines  Calcium with  Vitamin D- Your body requires 1200-1500 mg of Calcium a day and [phone] IU of Vitamin D a day.  You can only absorb 500 mg of Calcium at a time therefore Calcium must be taken in 2 or 3 separate doses throughout the day.  Hormones- Hormone therapy has been associated with increased risk for certain cancers and heart disease.  Talk to your healthcare provider about if you need relief from menopausal symptoms.  Aspirin- Ask your healthcare provider about taking Aspirin to prevent Heart Disease and Stroke.   Get these Immuniztions  Flu shot- Every fall  Pneumonia shot- Once after the age of 72; if you are younger ask your healthcare provider if you need a pneumonia shot.  Tetanus- Every ten years.  Shingrix- Once after the age of 92 to prevent shingles.   Take these steps  Don't smoke- Your healthcare provider can help you quit. For tips on how to quit, ask your healthcare provider or go to www.smokefree.gov or call 1-800 QUIT-NOW.  Be physically active- Exercise 5 days a week for a minimum of 30 minutes.  If you are not already physically active, start slow and gradually work up to 30 minutes of moderate physical activity.  Try walking, dancing, bike riding, swimming, etc.  Eat a healthy diet- Eat a variety of healthy foods such as fruits, vegetables, whole grains, low fat milk, low fat cheeses, yogurt, lean meats, chicken, fish, eggs, dried beans, tofu, etc.  For more information go to www.thenutritionsource.org  Dental visit- Brush and floss teeth twice daily; visit your dentist twice a year.  Eye exam- Visit your Optometrist or Ophthalmologist yearly.  Drink alcohol in moderation- Limit alcohol intake to one drink or less a day.  Never drink and drive.  Depression- Your emotional health is as important as your physical health.  If you're feeling down or losing interest in things you normally enjoy, please talk to your healthcare provider.  Seat Belts- can save your life; always wear  one  Smoke/Carbon Monoxide detectors- These detectors need to be installed on the appropriate level of your home.  Replace batteries at least once a year.  Violence- If anyone is threatening or hurting you, please tell your healthcare provider.  Living Will/ Health care power of attorney- Discuss with your healthcare provider and family.

## 2019-06-02 NOTE — Progress Notes (Signed)
Provider: Lauree Chandler, NP  Patient Care Team: Lauree Chandler, NP as PCP - General (Geriatric Medicine)  Extended Emergency Contact Information Primary Emergency Contact: [facility] Address: [address]          [place], [place] [address] Johnnette Litter of [employer] Phone: [phone] Relation: Daughter Preferred language: English Interpreter needed? No Allergies  Allergen Reactions  . Bactrim [Sulfamethoxazole-Trimethoprim]   . Keflex [Cephalexin]   . Omnicef [Cefdinir]   . Sulfa Antibiotics    Code Status: FULL Goals of Care: Advanced Directive information Advanced Directives 03/15/2019  Does Patient Have a Medical Advance Directive? No  Would patient like information on creating a medical advance directive? Yes (MAU/Ambulatory/Procedural Areas - Information given)     Chief Complaint  Patient presents with  . Annual Exam    Yearly physical and EKG. Pap completed by GYN   . Quality Metric Gaps    Discuss need for colonoscopy, patient would like to wait for Covid to resolve  . Best Practice Recommendations    Discuss need for HIV and Hep C Screening   . Immunizations    Discusss need for TDaP and flu vaccine (not in stock at [facility])   . Medication Management    90 day supply on all medications    HPI: Patient is a 63 y.o. female seen in today for an annual wellness exam.   After her husband had his stroke, her daughter wanted her and her husband to start seeing doctors around here.  She lives in [place] 3.5 hours away.   Diet? Heart healthy,trying to monitor diet and staying away from fatty, fried foods. Drinking more water.   Exercise? Walking more. 30 mins every day.   Dentition: every 6 months  Ophthalmology appt: up to date, last done in June  Routine specialist: GYN who has completed PAP and mammogram, sees Dr Lisbeth Renshaw.   Depression screen PHQ 2/9 03/15/2019  Decreased Interest 0  Down, Depressed, Hopeless 0  PHQ - 2 Score 0     Fall Risk  06/02/2019 03/15/2019  Falls in the past year? 0 0  Number falls in past yr: 0 0  Injury with Fall? 0 0   No flowsheet data found.   Health Maintenance  Topic Date Due  . Hepatitis C Screening  May 01, 1956  . HIV Screening  05/19/1970  . TETANUS/TDAP  05/19/1974  . COLONOSCOPY  05/19/2005  . INFLUENZA VACCINE  05/20/2019  . MAMMOGRAM  01/01/2021  . PAP SMEAR-Modifier  05/03/2022    Past Medical History:  Diagnosis Date  . Allergic rhinitis, unspecified   . Arthritis    Per [facility] new patient packet   . Asthma    Per [facility] new patient packet   . Gout, unspecified   . Hyperglycemia    Per labs completed on 03/15/2019 @ [facility]  . Hyperlipidemia   . Hypertension   . Irregular bowel habits    Per [facility] new patient packet   . Right knee injury    Stretched veins in right knee   . Upper respiratory infection   . Varicose vein of leg    Right, Per [facility] new patient packet   . Vitamin D deficiency     Past Surgical History:  Procedure Laterality Date  . [facility]     Social History   Socioeconomic History  . Marital status: Married    Spouse name: Not on file  . Number of  children: Not on file  . Years of education: Not on file  . Highest education level: Not on file  Occupational History  . Not on file  Social Needs  . Financial resource strain: Not on file  . Food insecurity    Worry: Not on file    Inability: Not on file  . Transportation needs    Medical: Not on file    Non-medical: Not on file  Tobacco Use  . Smoking status: Never Smoker  . Smokeless tobacco: Never Used  Substance and Sexual Activity  . Alcohol use: Not Currently  . Drug use: Not Currently  . Sexual activity: Not on file  Lifestyle  . Physical activity    Days per week: Not on file    Minutes per session: Not on file  . Stress: Not on file  Relationships  . Social Herbalist on phone: Not on file    Gets together: Not on file     Attends religious service: Not on file    Active member of club or organization: Not on file    Attends meetings of clubs or organizations: Not on file    Relationship status: Not on file  Other Topics Concern  . 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 No   Do you have difficulty preparing food or eating? No   Do you have difficulty managing your medications? No   Do you have difficulty managing your finances? No   Do you have difficulty affording your medications? No    Family History  Problem Relation Age of Onset  . High blood pressure Mother   . Hypertension Mother   . Heart disease Father   . COPD Father   . Heart disease Brother     Review of Systems:  Review of Systems  Constitutional: Negative for chills, fever and weight loss.  HENT: Negative for tinnitus.   Eyes:       Itchy watery eyes around pollen season  Respiratory: Positive for shortness of breath. Negative for cough, sputum production and wheezing.   Cardiovascular: Negative for chest pain, palpitations and leg swelling.  Gastrointestinal: Positive for constipation (controlled on probiotic and yogurt). Negative for abdominal pain, diarrhea and heartburn.  Genitourinary: Negative for dysuria, frequency and urgency.  Musculoskeletal: Positive for joint pain (right hand). Negative for back pain, falls and myalgias.  Skin: Negative.   Neurological: Negative for dizziness and headaches.  Endo/Heme/Allergies: Positive for environmental allergies.   Psychiatric/Behavioral: Negative for depression and memory loss. The patient does not have insomnia.     Allergies as of 06/02/2019      Reactions   Bactrim [sulfamethoxazole-trimethoprim]    Keflex [cephalexin]    Omnicef [cefdinir]    Sulfa Antibiotics  Medication List       Accurate as of June 02, 2019  2:09 PM. If you have any questions, ask your nurse or doctor.        acetaminophen 500 MG tablet Commonly known as: TYLENOL Take 500 mg by mouth as needed.   Albuterol Sulfate 2.5 MG/0.5ML Nebu Inhale into the lungs as needed.   albuterol 108 (90 Base) MCG/ACT inhaler Commonly known as: VENTOLIN HFA Inhale 2 puffs into the lungs every 6 (six) hours as needed for wheezing or shortness of breath.   allopurinol 100 MG tablet Commonly known as: ZYLOPRIM Take 1 tablet (100 mg total) by mouth daily.   amLODipine 5 MG tablet Commonly known as: NORVASC Take 1 tablet (5 mg total) by mouth daily.   azelastine 0.05 % ophthalmic solution Commonly known as: OPTIVAR Place 1 drop into both eyes 2 (two) times daily.   clotrimazole-betamethasone cream Commonly known as: LOTRISONE Apply 1 application topically as needed. To stomach area   ergocalciferol 1.25 MG (50000 UT) capsule Commonly known as: VITAMIN D2 Take 1 capsule (50,000 Units total) by mouth 2 (two) times a week.   fluticasone 50 MCG/ACT nasal spray Commonly known as: FLONASE Place 1 spray into both nostrils daily.   furosemide 40 MG tablet Commonly known as: LASIX Take 1 and 1/2 tablet by mouth daily   metoprolol succinate 25 MG 24 hr tablet Commonly known as: TOPROL-XL Take 1 tablet (25 mg total) by mouth daily.   montelukast 10 MG tablet Commonly known as: SINGULAIR Take 1 tablet (10 mg total) by mouth at bedtime.   nystatin powder Commonly known as: MYCOSTATIN/NYSTOP Apply topically as needed.   olmesartan 20 MG tablet Commonly known as: BENICAR Take 1 tablet (20 mg total) by mouth daily.    potassium chloride 10 MEQ CR capsule Commonly known as: MICRO-K Take 1 capsule (10 mEq total) by mouth daily.   tobramycin-dexamethasone ophthalmic ointment Commonly known as: TOBRADEX Place 1 application into the left eye 2 (two) times daily.         Physical Exam: Vitals:   06/02/19 1348 06/02/19 1435  BP: (!) 142/86 140/82  Pulse: 76   Temp: 98 F (36.7 C)   TempSrc: Oral   SpO2: 98%   Weight: (!) 313 lb (142 kg)   Height: _0  (1.778 m)    Body mass index is 44.91 kg/m. Wt Readings from Last 3 Encounters:  06/02/19 (!) 313 lb (142 kg)  03/15/19 (!) 331 lb 12.8 oz (150.5 kg)    Physical Exam Constitutional:      General: She is not in acute distress.    Appearance: She is well-developed. She is not diaphoretic.  HENT:     Head: Normocephalic and atraumatic.     Right Ear: Tympanic membrane, ear canal and external ear normal.     Left Ear: Tympanic membrane, ear canal and external ear normal.     Nose: Nose normal.     Mouth/Throat:     Mouth: Mucous membranes are moist.     Pharynx: No oropharyngeal exudate.  Eyes:     Conjunctiva/sclera: Conjunctivae normal.     Pupils: Pupils are equal, round, and reactive to light.  Neck:     Musculoskeletal: Normal range of motion and neck supple.  Cardiovascular:     Rate and Rhythm: Normal rate and regular rhythm.     Heart sounds: Normal heart sounds.  Pulmonary:     Effort: Pulmonary effort is normal.  Breath sounds: Normal breath sounds.  Abdominal:     General: Bowel sounds are normal.     Palpations: Abdomen is soft.  Musculoskeletal:        General: No swelling or tenderness.     Right knee: She exhibits normal range of motion, no swelling and no effusion.     Comments: crepitus on exam. Varicosities noted around knee   Skin:    General: Skin is warm and dry.  Neurological:     General: No focal deficit present.     Mental Status: She is alert and oriented to person, place, and time.   Psychiatric:        Mood and Affect: Mood normal.        Behavior: Behavior normal.     Labs reviewed: Basic Metabolic Panel: Recent Labs    03/15/19 0929  NA 140  K 4.9  CL 103  CO2 29  GLUCOSE 110*  BUN 19  CREATININE 0.94  CALCIUM 9.7   Liver Function Tests: Recent Labs    03/15/19 0929  AST 19  ALT 14  BILITOT 0.8  PROT 7.2   No results for input(s): LIPASE, AMYLASE in the last 8760 hours. No results for input(s): AMMONIA in the last 8760 hours. CBC: Recent Labs    03/15/19 0929  WBC 3.8  NEUTROABS 1,421*  HGB 14.4  HCT 43.1  MCV 82.7  PLT 261   Lipid Panel: Recent Labs    03/15/19 0929  CHOL 172  HDL 58  LDLCALC 94  TRIG 106  CHOLHDL 3.0   Lab Results  Component Value Date   HGBA1C 5.9 (H) 03/15/2019    Procedures: No results found.  Assessment/Plan 1. Essential hypertension - EKG 12-Lead showing PAC which pt reports is chronic for her. SR rate 78 -pt states she has white coat syndrome and will monitor BP at home. Goal <140/90, continue to work on dietary modifications.  - amLODipine (NORVASC) 5 MG tablet; Take 1 tablet (5 mg total) by mouth daily.  Dispense: 90 tablet; Refill: 1 - furosemide (LASIX) 40 MG tablet; Take 1 and 1/2 tablet by mouth daily  Dispense: 45 tablet; Refill: 1 - metoprolol succinate (TOPROL-XL) 25 MG 24 hr tablet; Take 1 tablet (25 mg total) by mouth daily.  Dispense: 90 tablet; Refill: 1 - olmesartan (BENICAR) 20 MG tablet; Take 1 tablet (20 mg total) by mouth daily.  Dispense: 90 tablet; Refill: 1 - potassium chloride (MICRO-K) 10 MEQ CR capsule; Take 1 capsule (10 mEq total) by mouth daily.  Dispense: 90 capsule; Refill: 1 - CBC with Differential/Platelet; Future  2. Chronic gout of right hand, unspecified cause -stable - allopurinol (ZYLOPRIM) 100 MG tablet; Take 1 tablet (100 mg total) by mouth daily.  Dispense: 90 tablet; Refill: 1 - Uric acid; Future  3. 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   4. Varicose veins of both legs with edema -stable at this time.  - furosemide (LASIX) 40 MG tablet; Take 1 and 1/2 tablet by mouth daily  Dispense: 45 tablet; Refill: 1 - potassium chloride (MICRO-K) 10 MEQ CR capsule; Take 1 capsule (10 mEq total) by mouth daily.  Dispense: 90 capsule; Refill: 1  5. Shortness of breath Has improved with weight loss but still persist, has evaluation with pulmonary today.  6. Vitamin D deficiency -will have her decrease vit D down to once daily -  ergocalciferol (VITAMIN D2) 1.25 MG (50000 UT) capsule; Take 1 capsule (50,000 Units total) by mouth once a week.  Dispense: 24 capsule; Refill: 1 - Vitamin D, 25-hydroxy; Future  7. Allergic rhinitis, unspecified seasonality, unspecified trigger -stable at this time.  - fluticasone (FLONASE) 50 MCG/ACT nasal spray; Place 1 spray into both nostrils daily.  Dispense: 16 g; Refill: 5  8. Hyperlipidemia, unspecified hyperlipidemia type -diet controlled, continue to work on diet and exercise.  - Lipid panel; Future - CMP with eGFR([facility]); Future  9. Hyperglycemia -continue to work on diet and exercise.  - Hemoglobin A1c; Future  10. Preventative health care -she is up to date on mammogram and PAP -The patient was counseled regarding the appropriate use of alcohol, regular self-examination of the breasts on a monthly basis, prevention of dental and periodontal disease, diet, regular sustained exercise for at least 30 minutes 5 times per week, routine screening interval for mammogram as recommended by the [place] and ACOG, importance of regular PAP smears,  and recommended schedule for GI hemoccult testing, colonoscopy, cholesterol, thyroid and diabetes screening. -pt is agreeable to cologuard, does not wish to have colonoscopy at this time -she was screened for HIV at gyn and declines hep C screening due to being low  risk.   Next appt: 6 months with lab work before visit  [place]. [place], [facility] [phone]

## 2019-06-02 NOTE — Assessment & Plan Note (Addendum)
Onset in her 16s maint on singulair  - 06/02/2019  After extensive coaching inhaler device,  effectiveness =    25% > try symbicort 80 Take 2 puffs first thing in am and then another 2 puffs about 12 hours later.    DDX of  difficult airways management almost all start with A and  include Adherence, Ace Inhibitors, Acid Reflux, Active Sinus Disease, Alpha 1 Antitripsin deficiency, Anxiety masquerading as Airways dz,  ABPA,  Allergy(esp in young), Aspiration (esp in elderly), Adverse effects of meds,  Active smoking or vaping, A bunch of PE's (a small clot burden can't cause this syndrome unless there is already severe underlying pulm or vascular dz with poor reserve) plus two Bs  = Bronchiectasis and Beta blocker use..and one C= CHF   Adherence is always the initial "prime suspect" and is a multilayered concern that requires a "trust but verify" approach in every patient - starting with knowing how to use medications, especially inhalers, correctly, keeping up with refills and understanding the fundamental difference between maintenance and prns vs those medications only taken for a very short course and then stopped and not refilled.  - see hfa teaching, will need to prove she can use hfa or change to neb as dpi problematic if uacs component which I strongly suspect  ? Acid (or non-acid) GERD > always difficult to exclude as up to 75% of pts in some series report no assoc GI/ Heartburn symptoms> rec max (24h)  acid suppression and diet restrictions/ reviewed and instructions given in writing.   ? Allergy > check profile, continue singulair  -  Also added 6 days of Prednisone in case of component of Th-2 driven upper or lower airways inflammation (if cough responds short term only to relapse befor return while will on rx for uacs that would point to poorly controlled allergic rhinitis/ asthma or eos bronchitis)    ? Adverse drug effects > none of the usual suspects listed  ? BB effects > unlikely on  this small of a dose of toprol  But note pulse rate suggesting she may be overly BB sensitive and need to consider alternativ rx  ? chf  > nothing to suggest clinically nor on cxr.     Total time devoted to counseling  > 50 % of initial 60 min office visit:  reviewed case with pt/I performed device teaching  using a teach back technique which also  extended face to face time for this visit (see above) / discussion of options/alternatives/ personally creating written customized instructions  in presence of pt  then going over those specific  Instructions directly with the pt including how to use all of the meds but in particular covering each new medication in detail and the difference between the maintenance= "automatic" meds and the prns using an action plan format for the latter (If this problem/symptom => do that organization reading Left to right).  Please see AVS from this visit for a full list of these instructions which I personally wrote for this pt and  are unique to this visit.

## 2019-06-02 NOTE — Patient Instructions (Addendum)
Plan A = Automatic = symbicort 80 Take 2 puffs first thing in am and then another 2 puffs about 12 hours later   Work on inhaler technique:  relax and gently blow all the way out then take a nice smooth deep breath back in, triggering the inhaler at same time you start breathing in.  Hold for up to 5 seconds if you can. Blow out thru nose. Rinse and gargle with water when done   Continue singulair daily   Pantoprazole (protonix) 40 mg   Take  30-60 min before first meal of the day and Pepcid (famotidine)  20 mg one after supper until return to office - this is the best way to tell whether stomach acid is contributing to your problem.    GERD (REFLUX)  is an extremely common cause of respiratory symptoms just like yours , many times with no obvious heartburn at all.    It can be treated with medication, but also with lifestyle changes including elevation of the head of your bed (ideally with 6 -8inch blocks under the headboard of your bed),  Smoking cessation, avoidance of late meals, excessive alcohol, and avoid fatty foods, chocolate, peppermint, colas, red wine, and acidic juices such as orange juice.  NO MINT OR MENTHOL PRODUCTS SO NO COUGH DROPS  USE SUGARLESS CANDY INSTEAD (Jolley ranchers or Stover's or Life Savers) or even ice chips will also do - the key is to swallow to prevent all throat clearing. NO OIL BASED VITAMINS - use powdered substitutes.  Avoid fish oil when coughing.     Plan B = Backup Only use your albuterol (PROAIR)  inhaler as a rescue medication to be used if you can't catch your breath by resting or doing a relaxed purse lip breathing pattern.  - The less you use it, the better it will work when you need it. - Ok to use the inhaler up to 2 puffs  every 4 hours if you must but call for appointment if use goes up over your usual need - Don't leave home without it !!  (think of it like the spare tire for your car)   Plan C = Crisis - only use your albuterol nebulizer  if you first try Plan B and it fails to help > ok to use the nebulizer up to every 4 hours but if start needing it regularly call for immediate appointment    Prednisone 10 mg take  4 each am x 2 days,   2 each am x 2 days,  1 each am x 2 days and stop    Please remember to go to the lab and x-ray department   for your tests - we will call you with the results when they are available.   Please schedule a follow up office visit in 2 weeks, sooner if needed  with all medications /inhalers/ solutions in hand so we can verify exactly what you are taking. This includes all medications from all doctors and over the counters

## 2019-06-02 NOTE — Progress Notes (Signed)
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 Pt states dxed with Asthma years ago. She feels that this is under control. She states been using her albuterol inhaler 2 x daily over the past 2 wks "since it's been humid".   Dyspnea:  MMRC2 = can't walk a nl pace on a flat grade s sob but does fine slow and flat  Cough: only right after saba > white mucus only   Worse on ventolin vs  proair but very poor technique on both - see a/p  Sleep: on side  Ok  / worse breathing supine x years = smothering/ sometimes choking supine SABA use: as above  Also using nebulizer once a day most days helps some    No obvious patterns in  day to day or daytime variability or assoc purulent sputum or mucus plugs or hemoptysis or cp or chest tightness, subjective wheeze or overt sinus or hb symptoms.   Sleeping as above ok  Off back  without nocturnal  or early am exacerbation  of respiratory  c/o's or need for noct saba. Also denies any obvious fluctuation of symptoms with weather or environmental changes or other aggravating or alleviating factors except as outlined above   No unusual exposure hx or h/o childhood pna/ asthma or knowledge of premature birth.  Current Allergies, Complete Past Medical History, Past Surgical History, Family History, and Social History were reviewed in  [employer] record.  ROS  The following are not active complaints unless bolded Hoarseness, sore throat, dysphagia, dental problems, itching, sneezing,  nasal congestion or discharge of excess mucus or purulent secretions, ear ache,   fever, chills, sweats, unintended wt loss or wt gain, classically pleuritic or exertional cp,  orthopnea pnd or arm/hand swelling  or leg swelling, presyncope, palpitations, abdominal pain, anorexia, nausea, vomiting, diarrhea  or change in bowel habits or change in bladder habits, change in stools or change in urine, dysuria, hematuria,  rash, arthralgias, visual complaints, headache, numbness, weakness or ataxia or problems with walking or coordination,  change in mood or  memory.              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     albuterol (VENTOLIN HFA) 108 (90 Base) MCG/ACT inhaler Inhale 2 puffs into the lungs every 6 (six) hours as needed for wheezing or shortness of breath. 18 g 0   Albuterol Sulfate 2.5 MG/0.5ML NEBU Inhale into the lungs as needed.     allopurinol (ZYLOPRIM) 100 MG tablet Take 1 tablet (100 mg total) by mouth daily. 90 tablet 1   amLODipine (NORVASC) 5 MG tablet Take 1 tablet (5 mg total) by mouth daily. 90 tablet 1   azelastine (OPTIVAR) 0.05 % ophthalmic solution Place 1 drop into both  eyes 2 (two) times daily.     clotrimazole-betamethasone (LOTRISONE) cream Apply 1 application topically as needed. To stomach area     ergocalciferol (VITAMIN D2) 1.25 MG (50000 UT) capsule Take 1 capsule (50,000 Units total) by mouth once a week. 24 capsule 1   fluticasone (FLONASE) 50 MCG/ACT nasal spray Place 1 spray into both nostrils daily. 16 g 5   furosemide (LASIX) 40 MG tablet Take 1 and 1/2 tablet by mouth daily 45 tablet 1   metoprolol succinate (TOPROL-XL) 25 MG 24 hr tablet Take 1 tablet (25 mg total) by mouth daily. 90 tablet 1   montelukast (SINGULAIR) 10 MG tablet Take 1 tablet (10 mg total) by mouth at bedtime. 30 tablet 0   nystatin (MYCOSTATIN/NYSTOP) powder Apply topically as needed.     olmesartan (BENICAR) 20 MG tablet Take 1 tablet (20 mg total) by mouth daily. 90 tablet 1   potassium chloride (MICRO-K) 10 MEQ CR capsule Take 1 capsule (10 mEq total) by mouth daily. 90 capsule 1   tobramycin-dexamethasone (TOBRADEX) ophthalmic ointment Place 1 application into the left eye 2 (two) times daily.        Objective:     BP 124/68 (BP Location: Left Arm, Cuff Size: Large)    Pulse (!) 45    Temp 98.7 F (37.1 C) (Oral)    Ht 5' 10.5" (1.791 m)    Wt (!) 322 lb (146.1 kg)    SpO2 100% Comment: on RA   BMI 45.55 kg/m   SpO2: 100 %(on RA)   Pleasant jovial amb obese bf nad    HEENT: nl dentition, turbinates bilaterally, and oropharynx. Nl external ear canals without cough reflex   NECK :  without JVD/Nodes/TM/ nl carotid upstrokes bilaterally   LUNGS: no acc muscle use,  Nl contour chest with striking upper > lower airway wheezeing  bilaterally without cough on insp or exp maneuvers   CV:  RRR  no s3 or murmur or increase in P2, and no edema   ABD:  soft and nontender with nl inspiratory excursion in the supine position. No bruits or organomegaly appreciated, bowel sounds nl  MS:  Nl gait/ ext warm without deformities, calf tenderness, cyanosis or  clubbing No obvious joint restrictions   SKIN: warm and dry without lesions    NEURO:  alert, approp, nl sensorium with  no motor or cerebellar deficits apparent.    Labs ordered 06/02/2019  :  allergy profile     CXR PA and Lateral:   06/02/2019 :    I personally reviewed images and agree with radiology impression as follows:    No active cardiopulmonary disease.  Assessment   Cough variant asthma vs UACS/ vcd  Onset in her 20s maint on singulair  - 06/02/2019  After extensive coaching inhaler device,  effectiveness =    25% > try symbicort 80 Take 2 puffs first thing in am and then another 2 puffs about 12 hours later.    DDX of  difficult airways management almost all start with A and  include Adherence, Ace Inhibitors, Acid Reflux, Active Sinus Disease, Alpha 1 Antitripsin deficiency, Anxiety masquerading as Airways dz,  ABPA,  Allergy(esp in young), Aspiration (esp in elderly), Adverse effects of meds,  Active smoking or vaping, A bunch of PE's (a small clot burden can't cause this syndrome unless there is already severe underlying pulm or vascular dz with poor reserve) plus two Bs  = Bronchiectasis and Beta blocker use..and one C= CHF   Adherence is always the initial "prime suspect" and is a multilayered concern that requires a "trust but verify" approach in every patient - starting with knowing how to use medications, especially inhalers, correctly, keeping up with refills and understanding the fundamental difference between maintenance and prns vs those medications only taken for a very short course and then stopped and not refilled.  - see hfa teaching, will need to prove she can use hfa or change to neb as dpi problematic if uacs component which I strongly suspect  ? Acid (or non-acid) GERD > always difficult to exclude as up to 75% of pts in some series report no assoc GI/ Heartburn symptoms> rec max (24h)  acid suppression and diet restrictions/ reviewed and instructions  given in writing.   ? Allergy > check profile, continue singulair  -  Also added 6 days of Prednisone in case of component of Th-2 driven upper or lower airways inflammation (if cough responds short term only to relapse befor return while will on rx for uacs that would point to poorly controlled allergic rhinitis/ asthma or eos bronchitis)    ? Adverse drug effects > none of the usual suspects listed  ? BB effects > unlikely on this small of a dose of toprol  But note pulse rate suggesting she may be overly BB sensitive and need to consider alternativ rx  ? chf  > nothing to suggest clinically nor on cxr.     Morbid (severe) obesity due to excess calories (HCC) Body mass index is 45.55 kg/m.    Lab Results  Component Value Date   TSH 4.11 07/14/2016    Contributing to gerd risk/ doe/reviewed the need and the process to achieve and maintain neg calorie balance > defer f/u primary care including intermittently monitoring thyroid status        Total time devoted to counseling  > 50 % of initial 60 min office visit:  reviewed case with pt/I performed device teaching  using a teach back technique which also  extended face to face time for this visit (see above) / discussion of options/alternatives/ personally creating written customized instructions  in presence of pt  then going over those specific  Instructions directly with the pt including how to use all of the meds but in particular covering each new medication in detail and the difference between the maintenance= "automatic" meds and the prns using an action plan format for the latter (If this problem/symptom => do that organization reading Left to right).  Please see AVS from this visit for a full list of these instructions which I personally wrote for this pt and  are unique to this visit.   Sandrea HughsMichael Dasja Brase, MD 06/02/2019

## 2019-06-03 ENCOUNTER — Encounter: Payer: Self-pay | Admitting: Internal Medicine

## 2019-06-03 NOTE — Assessment & Plan Note (Signed)
Body mass index is 45.55 kg/m.    Lab Results  Component Value Date   TSH 4.11 07/14/2016     Contributing to gerd risk/ doe/reviewed the need and the process to achieve and maintain neg calorie balance > defer f/u primary care including intermittently monitoring thyroid status

## 2019-06-05 LAB — CBC WITH DIFFERENTIAL/PLATELET
Absolute Monocytes: 605 cells/uL (ref 200–950)
Basophils Absolute: 22 cells/uL (ref 0–200)
Basophils Relative: 0.4 %
Eosinophils Absolute: 81 cells/uL (ref 15–500)
Eosinophils Relative: 1.5 %
HCT: 44.1 % (ref 35.0–45.0)
Hemoglobin: 14.5 g/dL (ref 11.7–15.5)
Lymphs Abs: 2786 cells/uL (ref 850–3900)
MCH: 28.4 pg (ref 27.0–33.0)
MCHC: 32.9 g/dL (ref 32.0–36.0)
MCV: 86.3 fL (ref 80.0–100.0)
MPV: 11.2 fL (ref 7.5–12.5)
Monocytes Relative: 11.2 %
Neutro Abs: 1906 cells/uL (ref 1500–7800)
Neutrophils Relative %: 35.3 %
Platelets: 262 10*3/uL (ref 140–400)
RBC: 5.11 10*6/uL — ABNORMAL HIGH (ref 3.80–5.10)
RDW: 12.8 % (ref 11.0–15.0)
Total Lymphocyte: 51.6 %
WBC: 5.4 10*3/uL (ref 3.8–10.8)

## 2019-06-05 LAB — RESPIRATORY ALLERGY PROFILE REGION II ~~LOC~~

## 2019-06-05 LAB — INTERPRETATION:

## 2019-06-05 NOTE — Telephone Encounter (Signed)
Patient sent email stating she needs to reschedule her appointment.  Dr. Melvyn Novas has dates available for her.  Gave her our office number to reschedule.

## 2019-06-07 ENCOUNTER — Encounter: Payer: Self-pay | Admitting: Nurse Practitioner

## 2019-06-09 NOTE — Addendum Note (Signed)
Addended by: Logan Bores on: 06/09/2019 04:02 PM   Modules accepted: Orders

## 2019-06-19 ENCOUNTER — Ambulatory Visit: Payer: Commercial Managed Care - PPO | Admitting: Internal Medicine

## 2019-06-22 ENCOUNTER — Ambulatory Visit: Payer: Commercial Managed Care - PPO | Admitting: Internal Medicine

## 2019-06-22 ENCOUNTER — Other Ambulatory Visit: Payer: Self-pay

## 2019-06-22 ENCOUNTER — Encounter: Payer: Self-pay | Admitting: Internal Medicine

## 2019-06-22 DIAGNOSIS — J45991 Cough variant asthma: Secondary | ICD-10-CM

## 2019-06-22 MED ORDER — FAMOTIDINE 20 MG PO TABS: ORAL_TABLET | ORAL | 3 refills | Status: DC

## 2019-06-22 MED ORDER — PANTOPRAZOLE SODIUM 40 MG PO TBEC: 40.0000 mg | DELAYED_RELEASE_TABLET | Freq: Every day | ORAL | 3 refills | Status: DC

## 2019-06-22 MED ORDER — BUDESONIDE-FORMOTEROL FUMARATE 80-4.5 MCG/ACT IN AERO: 2.0000 | INHALATION_SPRAY | Freq: Two times a day (BID) | RESPIRATORY_TRACT | 3 refills | Status: DC

## 2019-06-22 NOTE — Patient Instructions (Addendum)
To get the most out of exercise, you need to be continuously aware that you are short of breath, but never out of breath, for 30 minutes daily. As you improve, it will actually be easier for you to do the same amount of exercise  in  30 minutes so always push to the level where you are short of breath.     See me at 130 pm on 12/03/2018 so you can see your PCP afterwards

## 2019-06-22 NOTE — Progress Notes (Signed)
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 Pt states dxed with Asthma years ago. She feels that this is under control. She states been using her albuterol inhaler 2 x daily over the past 2 wks "since it's been humid".   Dyspnea:  MMRC2 = can't walk a nl pace on a flat grade s sob but does fine slow and flat  Cough: only right after saba > white mucus only   Worse on ventolin vs  proair but very poor technique on both - see a/p  Sleep: on side  Ok  / worse breathing supine x years = smothering/ sometimes choking supine SABA use: as above  Also using nebulizer once a day most days helps some rec Plan A = Automatic = symbicort 80 Take 2 puffs first thing in am and then another 2 puffs about 12 hours later  Work on inhaler technique Continue singulair daily  Pantoprazole (protonix) 40 mg   Take  30-60 min before first meal of the day and Pepcid (famotidine)  20 mg one after supper until return to office - this is the best way to tell whether stomach acid is contributing to your problem.   GERD diet   Plan B = Backup Only use your albuterol (PROAIR)  inhaler as a rescue medication  Plan C = Crisis - only use your albuterol nebulizer if you first try Plan B and it fails to help > ok to use the nebulizer up to every 4 hours but if start needing it regularly call for  immediate appointment Prednisone 10 mg take  4 each am x 2 days,   2 each am x 2 days,  1 each am x 2 days and stop  Please schedule a follow up office visit in 2 weeks, sooner if needed  with all medications /inhalers/ solutions in hand so we can verify exactly what you are taking. This includes all medications from all doctors and over the counters    06/22/2019  f/u ov/Athalie Newhard re: likely asthma/ better on singulair/symb 58 2bid Chief Complaint  Patient presents with   Follow-up    Breathing is doing better and she has not had to use her albuterol inhaler or neb.   Dyspnea:  Not limited by breathing from desired activities   Cough: none Sleeping: now able to flat  SABA use: none 02: none    No obvious day to day or daytime variability or assoc excess/ purulent sputum or mucus plugs or hemoptysis or cp or chest tightness, subjective wheeze or overt sinus or hb symptoms.   Sleeping  without nocturnal  or early am exacerbation  of respiratory  c/o's or need for noct saba. Also denies any obvious fluctuation of symptoms with weather or environmental changes  or other aggravating or alleviating factors except as outlined above   No unusual exposure hx or h/o childhood pna/ asthma or knowledge of premature birth.  Current Allergies, Complete Past Medical History, Past Surgical History, Family History, and Social History were reviewed in [facility] electronic medical record.  ROS  The following are not active complaints unless bolded Hoarseness, sore throat, dysphagia, dental problems, itching, sneezing,  nasal congestion or discharge of excess mucus or purulent secretions, ear ache,   fever, chills, sweats, unintended wt loss or wt gain, classically pleuritic or exertional cp,  orthopnea pnd or arm/hand swelling  or leg swelling, presyncope, palpitations, abdominal pain, anorexia, nausea, vomiting, diarrhea  or change in bowel habits or change in bladder habits, change in stools or change in  urine, dysuria, hematuria,  rash, arthralgias, visual complaints, headache, numbness, weakness or ataxia or problems with walking or coordination,  change in mood or  memory.        Current Meds  Medication Sig   acetaminophen (TYLENOL) 500 MG tablet Take 500 mg by mouth as needed.   albuterol (VENTOLIN HFA) 108 (90 Base) MCG/ACT inhaler Inhale 2 puffs into the lungs every 6 (six) hours as needed for wheezing or shortness of breath.   Albuterol Sulfate 2.5 MG/0.5ML NEBU Inhale into the lungs as needed.   allopurinol (ZYLOPRIM) 100 MG tablet Take 1 tablet (100 mg total) by mouth daily.   amLODipine (NORVASC) 5 MG tablet Take 1 tablet (5 mg total) by mouth daily.   azelastine (OPTIVAR) 0.05 % ophthalmic solution Place 1 drop into both eyes 2 (two) times daily.   budesonide-formoterol (SYMBICORT) 80-4.5 MCG/ACT inhaler Inhale 2 puffs into the lungs 2 (two) times daily.   clotrimazole-betamethasone (LOTRISONE) cream Apply 1 application topically as needed. To stomach area   ergocalciferol (VITAMIN D2) 1.25 MG (50000 UT) capsule Take 1 capsule (50,000 Units total) by mouth once a week.   famotidine (PEPCID) 20 MG tablet One after supper   furosemide (LASIX) 40 MG tablet Take 1 and 1/2 tablet by mouth daily   metoprolol succinate (TOPROL-XL) 25 MG 24 hr tablet Take 1 tablet (25 mg total) by mouth daily.   montelukast (SINGULAIR) 10 MG tablet Take 1 tablet (10 mg total) by mouth at bedtime.   nystatin (MYCOSTATIN/NYSTOP) powder Apply topically as needed.   olmesartan (BENICAR) 20 MG tablet Take 1 tablet (20 mg total) by mouth daily.   pantoprazole (PROTONIX) 40 MG tablet Take 1 tablet (40 mg total) by mouth daily. Take 30-60 min before first meal of the day   potassium chloride (MICRO-K) 10 MEQ CR capsule Take 1 capsule (10 mEq total) by mouth daily.                    Past Medical History:  Diagnosis Date   Allergic rhinitis, unspecified    Arthritis    Per [facility] new  patient packet    Asthma    Per [facility] new patient packet    Gout, unspecified    Hyperglycemia    Per labs completed on 03/15/2019 @ [facility]   Hyperlipidemia    Hypertension    Irregular bowel habits    Per [facility] new patient packet    Right knee injury    Stretched veins in right knee    Upper respiratory infection    Varicose vein of leg    Right, Per [facility] new patient packet    Vitamin D deficiency  Objective:    amb obese pleasant bf nad/ minimal pseudowheeze resolves with plm    Wt Readings from Last 3 Encounters:  06/22/19 (!) 321 lb (145.6 kg)  06/02/19 (!) 322 lb (146.1 kg)  06/02/19 (!) 313 lb (142 kg)     Vital signs reviewed - Note on arrival 02 sats  99% on RA        HEENT: nl dentition, turbinates bilaterally, and oropharynx. Nl external ear canals without cough reflex   NECK :  without JVD/Nodes/TM/ nl carotid upstrokes bilaterally   LUNGS: no acc muscle use,  Nl contour chest which is clear to A and P bilaterally without cough on insp or exp maneuvers   CV:  RRR  no s3 or murmur or increase in P2, and no edema   ABD:  Obese and nontender with nl inspiratory excursion in the supine position. No bruits or organomegaly appreciated, bowel sounds nl  MS:  Nl gait/ ext warm without deformities, calf tenderness, cyanosis or clubbing No obvious joint restrictions   SKIN: warm and dry without lesions    NEURO:  alert, approp, nl sensorium with  no motor or cerebellar deficits apparent.            Assessment

## 2019-06-22 NOTE — Assessment & Plan Note (Signed)
Body mass index is 46.06 kg/m.  -  trending no change  Lab Results  Component Value Date   TSH 4.11 07/13/2016     Contributing to gerd risk/ doe/reviewed the need and the process to achieve and maintain neg calorie balance > defer f/u primary care including intermittently monitoring thyroid status     I had an extended discussion with the patient reviewing all relevant studies completed to date and  lasting 15 to 20 minutes of a 25 minute visit    I performed detailed device teaching using a teach back method which extended face to face time for this visit (see above)  Each maintenance medication was reviewed in detail including emphasizing most importantly the difference between maintenance and prns and under what circumstances the prns are to be triggered using an action plan format that is not reflected in the computer generated alphabetically organized AVS which I have not found useful in most complex patients, especially with respiratory illnesses  Please see AVS for specific instructions unique to this visit that I personally wrote and verbalized to the the pt in detail and then reviewed with pt  by my nurse highlighting any  changes in therapy recommended at today's visit to their plan of care.

## 2019-06-22 NOTE — Assessment & Plan Note (Signed)
Onset in her 105s maint on singulair  - 06/02/2019   try symbicort 80 2bid and max gerd rx  - Allergy profile  06/02/19 >  Eos 81/  IgE  97  RAST neg  - 06/22/2019  After extensive coaching inhaler device,  effectiveness =    75% (short Ti)  Despite suboptimal hfa > All goals of chronic asthma control met including optimal function and elimination of symptoms with minimal need for rescue therapy.  Contingencies discussed in full including contacting this office immediately if not controlling the symptoms using the rule of two's.

## 2019-06-23 ENCOUNTER — Ambulatory Visit: Payer: Commercial Managed Care - PPO | Admitting: Internal Medicine

## 2019-06-29 NOTE — Telephone Encounter (Signed)
Received email from patient  "My daughter just phoned to confirm my appointment for February 15th and learned that it had not been schedule when I was in office last week. She asked for it to be scheduled on February 15 at 1:30, but it looks like appointment was actually made for February 10th at 1:30. Can this please be updated."  Responded to email asking for her to call to reschedule appointment.  The 1:30 appointment on 12/04/19 is not available so she would need to pick a different time.

## 2019-08-02 ENCOUNTER — Encounter: Payer: Self-pay | Admitting: Nurse Practitioner

## 2019-08-02 DIAGNOSIS — I10 Essential (primary) hypertension: Secondary | ICD-10-CM

## 2019-08-02 DIAGNOSIS — I83893 Varicose veins of bilateral lower extremities with other complications: Secondary | ICD-10-CM

## 2019-08-02 DIAGNOSIS — E559 Vitamin D deficiency, unspecified: Secondary | ICD-10-CM

## 2019-08-02 MED ORDER — MONTELUKAST SODIUM 10 MG PO TABS: 10.0000 mg | ORAL_TABLET | Freq: Every day | ORAL | 1 refills | Status: DC

## 2019-08-02 MED ORDER — ERGOCALCIFEROL 1.25 MG (50000 UT) PO CAPS: 50000.0000 [IU] | ORAL_CAPSULE | ORAL | 1 refills | Status: DC

## 2019-08-02 MED ORDER — FUROSEMIDE 40 MG PO TABS: ORAL_TABLET | ORAL | 1 refills | Status: DC

## 2019-09-03 ENCOUNTER — Encounter: Payer: Self-pay | Admitting: Nurse Practitioner

## 2019-09-04 ENCOUNTER — Other Ambulatory Visit: Payer: Self-pay

## 2019-09-04 DIAGNOSIS — I10 Essential (primary) hypertension: Secondary | ICD-10-CM

## 2019-09-04 DIAGNOSIS — I83893 Varicose veins of bilateral lower extremities with other complications: Secondary | ICD-10-CM

## 2019-09-04 MED ORDER — FUROSEMIDE 40 MG PO TABS: ORAL_TABLET | ORAL | 0 refills | Status: DC

## 2019-09-04 MED ORDER — FUROSEMIDE 40 MG PO TABS: ORAL_TABLET | ORAL | 1 refills | Status: DC

## 2019-09-04 NOTE — Telephone Encounter (Signed)
Dr. Melvyn Novas patient's insurance not covering symbicort 80 needs PA done for this medication starting PA today 09/04/19. Patient states hasn't been taking it since 08/30/19 wanted you to know.   Please advise on replacement or continue with PA

## 2019-09-04 NOTE — Telephone Encounter (Signed)
After speaking with pharmacy and customer service patient needs RX filled for a 90 day supply. She can't have that refilled until 12/22.  However, customer service was able to override the request and start her on a 90 day supply today. She will have a $50 copay. Ruthe Mannan is not covered by her insurance.   Nothing further needed at this time. 90 day supply was sent in for patient.   Will route to Dr. Melvyn Novas as Juluis Rainier

## 2019-09-04 NOTE — Telephone Encounter (Signed)
Should first try dulera 100 2bid and see if that's covered and if not let us know what is and whether she responded to symbicort 80

## 2019-11-09 ENCOUNTER — Telehealth: Payer: Self-pay

## 2019-11-09 NOTE — Telephone Encounter (Signed)
Spoke to patient's daughter April. She stated patient was not able to take my call. Inquired about patient's cologuard kit mailed on 06/01/2019. Daughter states that she is aware of both her mom and dads kit not being done. They are having problems understanding the instructions. Both patient and husband are scheduled to visit daughter next month and bring kits with them. She stated she will be helping them get cologuard kits complete and sent in.   Routing to provider to inform.

## 2019-11-27 ENCOUNTER — Other Ambulatory Visit: Payer: Commercial Managed Care - PPO

## 2019-11-29 ENCOUNTER — Ambulatory Visit: Payer: Commercial Managed Care - PPO | Admitting: Internal Medicine

## 2019-12-01 ENCOUNTER — Other Ambulatory Visit: Payer: Self-pay | Admitting: *Deleted

## 2019-12-01 ENCOUNTER — Encounter: Payer: Self-pay | Admitting: Nurse Practitioner

## 2019-12-01 DIAGNOSIS — I10 Essential (primary) hypertension: Secondary | ICD-10-CM

## 2019-12-01 DIAGNOSIS — I83893 Varicose veins of bilateral lower extremities with other complications: Secondary | ICD-10-CM

## 2019-12-01 DIAGNOSIS — M1A0410 Idiopathic chronic gout, right hand, without tophus (tophi): Secondary | ICD-10-CM

## 2019-12-01 MED ORDER — ALLOPURINOL 100 MG PO TABS: 100.0000 mg | ORAL_TABLET | Freq: Every day | ORAL | 1 refills | Status: DC

## 2019-12-01 NOTE — Telephone Encounter (Signed)
Pt now lives in [place], ok to refill?

## 2019-12-04 ENCOUNTER — Ambulatory Visit: Payer: Commercial Managed Care - PPO | Admitting: Internal Medicine

## 2019-12-04 ENCOUNTER — Ambulatory Visit: Payer: Commercial Managed Care - PPO | Admitting: Nurse Practitioner

## 2019-12-04 MED ORDER — METOPROLOL SUCCINATE ER 25 MG PO TB24: 25.0000 mg | ORAL_TABLET | Freq: Every day | ORAL | 0 refills | Status: DC

## 2019-12-04 MED ORDER — POTASSIUM CHLORIDE ER 10 MEQ PO CPCR: 10.0000 meq | ORAL_CAPSULE | Freq: Every day | ORAL | 0 refills | Status: DC

## 2019-12-04 MED ORDER — AMLODIPINE BESYLATE 5 MG PO TABS: 5.0000 mg | ORAL_TABLET | Freq: Every day | ORAL | 0 refills | Status: DC

## 2019-12-04 MED ORDER — OLMESARTAN MEDOXOMIL 20 MG PO TABS: 20.0000 mg | ORAL_TABLET | Freq: Every day | ORAL | 0 refills | Status: DC

## 2019-12-04 NOTE — Telephone Encounter (Signed)
I replied to patients daughter. Message sent to [facility] for additional comments or recommendations.

## 2020-01-14 ENCOUNTER — Encounter: Payer: Self-pay | Admitting: Nurse Practitioner

## 2020-01-18 ENCOUNTER — Other Ambulatory Visit: Payer: Self-pay | Admitting: Nurse Practitioner

## 2020-01-18 DIAGNOSIS — I10 Essential (primary) hypertension: Secondary | ICD-10-CM

## 2020-01-18 DIAGNOSIS — I83893 Varicose veins of bilateral lower extremities with other complications: Secondary | ICD-10-CM

## 2020-01-24 LAB — HM MAMMOGRAPHY

## 2020-02-08 ENCOUNTER — Other Ambulatory Visit: Payer: Commercial Managed Care - PPO

## 2020-02-08 ENCOUNTER — Other Ambulatory Visit: Payer: Self-pay

## 2020-02-08 DIAGNOSIS — E559 Vitamin D deficiency, unspecified: Secondary | ICD-10-CM

## 2020-02-08 DIAGNOSIS — R739 Hyperglycemia, unspecified: Secondary | ICD-10-CM

## 2020-02-08 DIAGNOSIS — E785 Hyperlipidemia, unspecified: Secondary | ICD-10-CM

## 2020-02-08 DIAGNOSIS — M1A0410 Idiopathic chronic gout, right hand, without tophus (tophi): Secondary | ICD-10-CM

## 2020-02-08 DIAGNOSIS — I10 Essential (primary) hypertension: Secondary | ICD-10-CM

## 2020-02-09 ENCOUNTER — Encounter: Payer: Self-pay | Admitting: Nurse Practitioner

## 2020-02-09 LAB — COMPLETE METABOLIC PANEL WITH GFR
AG Ratio: 1.3 (calc) (ref 1.0–2.5)
ALT: 14 U/L (ref 6–29)
AST: 19 U/L (ref 10–35)
Albumin: 3.9 g/dL (ref 3.6–5.1)
Alkaline phosphatase (APISO): 79 U/L (ref 37–153)
BUN/Creatinine Ratio: 20 (calc) (ref 6–22)
BUN: 21 mg/dL (ref 7–25)
CO2: 28 mmol/L (ref 20–32)
Calcium: 9.4 mg/dL (ref 8.6–10.4)
Chloride: 107 mmol/L (ref 98–110)
Creat: 1.05 mg/dL — ABNORMAL HIGH (ref 0.50–0.99)
GFR, Est African American: 65 mL/min/{1.73_m2} (ref >=60)
GFR, Est Non African American: 56 mL/min/{1.73_m2} — ABNORMAL LOW (ref >=60)
Globulin: 3 g/dL (calc) (ref 1.9–3.7)
Glucose, Bld: 98 mg/dL (ref 65–99)
Potassium: 4.6 mmol/L (ref 3.5–5.3)
Sodium: 142 mmol/L (ref 135–146)
Total Bilirubin: 0.7 mg/dL (ref 0.2–1.2)
Total Protein: 6.9 g/dL (ref 6.1–8.1)

## 2020-02-09 LAB — HEMOGLOBIN A1C
Hgb A1c MFr Bld: 6 % of total Hgb — ABNORMAL HIGH (ref <5.7)
Mean Plasma Glucose: 126 (calc)
eAG (mmol/L): 7 (calc)

## 2020-02-09 LAB — URIC ACID: Uric Acid, Serum: 5.6 mg/dL (ref 2.5–7.0)

## 2020-02-09 LAB — LIPID PANEL
Cholesterol: 144 mg/dL (ref <200)
HDL: 57 mg/dL (ref >=50)
LDL Cholesterol (Calc): 73 mg/dL (calc)
Non-HDL Cholesterol (Calc): 87 mg/dL (calc) (ref <130)
Total CHOL/HDL Ratio: 2.5 (calc) (ref <5.0)
Triglycerides: 59 mg/dL (ref <150)

## 2020-02-09 LAB — CBC WITH DIFFERENTIAL/PLATELET
Absolute Monocytes: 545 cells/uL (ref 200–950)
Basophils Absolute: 32 cells/uL (ref 0–200)
Basophils Relative: 0.7 %
Eosinophils Absolute: 50 cells/uL (ref 15–500)
Eosinophils Relative: 1.1 %
HCT: 42.5 % (ref 35.0–45.0)
Hemoglobin: 13.9 g/dL (ref 11.7–15.5)
Lymphs Abs: 2259 cells/uL (ref 850–3900)
MCH: 27.7 pg (ref 27.0–33.0)
MCHC: 32.7 g/dL (ref 32.0–36.0)
MCV: 84.8 fL (ref 80.0–100.0)
MPV: 10.6 fL (ref 7.5–12.5)
Monocytes Relative: 12.1 %
Neutro Abs: 1616 cells/uL (ref 1500–7800)
Neutrophils Relative %: 35.9 %
Platelets: 284 10*3/uL (ref 140–400)
RBC: 5.01 10*6/uL (ref 3.80–5.10)
RDW: 13 % (ref 11.0–15.0)
Total Lymphocyte: 50.2 %
WBC: 4.5 10*3/uL (ref 3.8–10.8)

## 2020-02-09 LAB — COLOGUARD

## 2020-02-09 LAB — VITAMIN D 25 HYDROXY (VIT D DEFICIENCY, FRACTURES): Vit D, 25-Hydroxy: 71 ng/mL (ref 30–100)

## 2020-02-12 ENCOUNTER — Encounter: Payer: Self-pay | Admitting: Internal Medicine

## 2020-02-12 ENCOUNTER — Ambulatory Visit (INDEPENDENT_AMBULATORY_CARE_PROVIDER_SITE_OTHER): Payer: Commercial Managed Care - PPO | Admitting: Nurse Practitioner

## 2020-02-12 ENCOUNTER — Other Ambulatory Visit: Payer: Self-pay

## 2020-02-12 ENCOUNTER — Encounter: Payer: Self-pay | Admitting: Nurse Practitioner

## 2020-02-12 ENCOUNTER — Ambulatory Visit: Payer: Commercial Managed Care - PPO | Admitting: Internal Medicine

## 2020-02-12 VITALS — BP 138/84 | HR 92 | Temp 97.8°F | Ht 70.0 in | Wt 320.2 lb

## 2020-02-12 DIAGNOSIS — J45991 Cough variant asthma: Secondary | ICD-10-CM | POA: Diagnosis not present

## 2020-02-12 DIAGNOSIS — M1A0410 Idiopathic chronic gout, right hand, without tophus (tophi): Secondary | ICD-10-CM

## 2020-02-12 DIAGNOSIS — I10 Essential (primary) hypertension: Secondary | ICD-10-CM

## 2020-02-12 DIAGNOSIS — E559 Vitamin D deficiency, unspecified: Secondary | ICD-10-CM | POA: Diagnosis not present

## 2020-02-12 DIAGNOSIS — I83893 Varicose veins of bilateral lower extremities with other complications: Secondary | ICD-10-CM

## 2020-02-12 DIAGNOSIS — R739 Hyperglycemia, unspecified: Secondary | ICD-10-CM

## 2020-02-12 DIAGNOSIS — M542 Cervicalgia: Secondary | ICD-10-CM

## 2020-02-12 DIAGNOSIS — E785 Hyperlipidemia, unspecified: Secondary | ICD-10-CM

## 2020-02-12 DIAGNOSIS — B372 Candidiasis of skin and nail: Secondary | ICD-10-CM

## 2020-02-12 MED ORDER — VITAMIN D (CHOLECALCIFEROL) 25 MCG (1000 UT) PO CAPS: 1000.0000 [IU] | ORAL_CAPSULE | Freq: Every day | ORAL | Status: DC

## 2020-02-12 MED ORDER — NYSTATIN 100000 UNIT/GM EX CREA: 1.0000 "application " | TOPICAL_CREAM | Freq: Two times a day (BID) | CUTANEOUS | 3 refills | Status: DC | PRN

## 2020-02-12 NOTE — Patient Instructions (Signed)
To use aleve 1 tablet twice daily for 5 days with food for neck pain To make sure you have a supportive pillow To use heating pad to neck 3 times daily ~20 mins with muscle rub afterward  To use cotton t shirt to help wick away moisture if needed To dry area thoroughly before applying powder Do not use peroxide

## 2020-02-12 NOTE — Patient Instructions (Addendum)
Symbicort 80  1-2 puffs every 12 hours    Only use your albuterol as a rescue medication to be used if you can't catch your breath by resting or doing a relaxed purse lip breathing pattern.  - The less you use it, the better it will work when you need it. - Ok to use up to 2 puffs  every 4 hours if you must but call for immediate appointment if use goes up over your usual need - Don't leave home without it !!  (think of it like the spare tire for your car)    Please schedule a follow up visit in 12 months but call sooner if needed

## 2020-02-12 NOTE — Progress Notes (Signed)
Traci Gomez, female    DOB: 1956-05-13,     MRN: [id number]   Brief patient profile:  51 yobf from [facility]  Never smoker worked in [employer], clinical in [employer] with onset sob dx as asthma in her 51s and rx singulair/prn saba  for decades  and better since 2005 when moved from  that office did better and rarely needed saba but worse in hot humid weather so increased to twice daily x saba since early August 2020 and  referred to pulmonary clinic 06/02/2019 by Abbey Chatters      History of Present Illness  06/02/2019  Pulmonary/ 1st office eval/Breanda Greenlaw on maint rx with singulair only  Chief Complaint  Patient presents with  . Pulmonary Consult    Referred by Abbey Chatters. Pt states dxed with Asthma years ago. She feels that this is under control. She states been using her albuterol inhaler 2 x daily over the past 2 wks "since it's been humid".   Dyspnea:  MMRC2 = can't walk a nl pace on a flat grade s sob but does fine slow and flat  Cough: only right after saba > white mucus only   Worse on ventolin vs  proair but very poor technique on both - see a/p  Sleep: on side  Ok  / worse breathing supine x years = smothering/ sometimes choking supine SABA use: as above  Also using nebulizer once a day most days helps some rec Plan A = Automatic = symbicort 80 Take 2 puffs first thing in am and then another 2 puffs about 12 hours later  Work on inhaler technique Continue singulair daily  Pantoprazole (protonix) 40 mg   Take  30-60 min before first meal of the day and Pepcid (famotidine)  20 mg one after supper until return to office - this is the best way to tell whether stomach acid is contributing to your problem.   GERD diet   Plan B = Backup Only use your albuterol (PROAIR)  inhaler as a rescue medication  Plan C = Crisis - only use your albuterol nebulizer if you first try Plan B and it fails to help > ok to use the nebulizer up to every 4 hours but if start needing it regularly call for  immediate appointment Prednisone 10 mg take  4 each am x 2 days,   2 each am x 2 days,  1 each am x 2 days and stop  Please schedule a follow up office visit in 2 weeks, sooner if needed  with all medications /inhalers/ solutions in hand so we can verify exactly what you are taking. This includes all medications from all doctors and over the counters    06/22/2019  f/u ov/Cullin Dishman re: likely asthma/ better on singulair/symb 80 2bid Chief Complaint  Patient presents with  . Follow-up    Breathing is doing better and she has not had to use her albuterol inhaler or neb.   Dyspnea:  Not limited by breathing from desired activities   Cough: none Sleeping: now able to flat  SABA use: none 02: none  rec To get the most out of exercise, you need to be continuously aware that you are short of breath     02/12/2020  f/u ov/Latia Mataya re: probable asthma/ maint on symb 80 2bid/singulair   Chief Complaint  Patient presents with  . Follow-up    Breathing is much improved. She has not had to use her albuterol inhaler or neb.  Dyspnea:  Not limited /  walking 30 min daily no aerobics though  Cough: none Sleeping: able to lie flat fine / one pillow SABA use: none 02: none    No obvious day to day or daytime variability or assoc excess/ purulent sputum or mucus plugs or hemoptysis or cp or chest tightness, subjective wheeze or overt sinus or hb symptoms.   Sleeping  without nocturnal  or early am exacerbation  of respiratory  c/o's or need for noct saba. Also denies any obvious fluctuation of symptoms with weather or environmental changes or other aggravating or alleviating factors except as outlined above   No unusual exposure hx or h/o childhood pna/ asthma or knowledge of premature birth.  Current Allergies, Complete Past Medical History, Past Surgical History, Family History, and Social History were reviewed in [employer] record.  ROS  The following are not active complaints  unless bolded Hoarseness, sore throat, dysphagia, dental problems, itching, sneezing,  nasal congestion or discharge of excess mucus or purulent secretions, ear ache,   fever, chills, sweats, unintended wt loss or wt gain, classically pleuritic or exertional cp,  orthopnea pnd or arm/hand swelling  or leg swelling, presyncope, palpitations, abdominal pain, anorexia, nausea, vomiting, diarrhea  or change in bowel habits or change in bladder habits, change in stools or change in urine, dysuria, hematuria,  rash, arthralgias, visual complaints, headache, numbness, weakness or ataxia or problems with walking or coordination,  change in mood or  memory.        Current Meds  Medication Sig  . budesonide-formoterol (SYMBICORT) 80-4.5 MCG/ACT inhaler Inhale 2 puffs into the lungs 2 (two) times daily.  . metoprolol succinate (TOPROL-XL) 25 MG 24 hr tablet Take 1 tablet (25 mg total) by mouth daily.                          Past Medical History:  Diagnosis Date  . Allergic rhinitis, unspecified   . Arthritis    Per [place] new patient packet   . Asthma    Per [place] new patient packet   . Gout, unspecified   . Hyperglycemia    Per labs completed on 03/15/2019 @ [facility]  . Hyperlipidemia   . Hypertension   . Irregular bowel habits    Per [place] new patient packet   . Right knee injury    Stretched veins in right knee   . Upper respiratory infection   . Varicose vein of leg    Right, Per [facility] new patient packet   . Vitamin D deficiency          Objective:        02/12/2020       320  06/22/19 (!) 321 lb (145.6 kg)  06/02/19 (!) 322 lb (146.1 kg)  06/02/19 (!) 313 lb (142 kg)    amb pleasant obese bf nad with   minimal pseudowheeze resolves with plm    Vital signs reviewed  02/12/2020  - Note at rest 02 sats  97% on RA    HEENT : pt wearing mask not removed for exam due to covid -19 concerns.    NECK :  without JVD/Nodes/TM/ nl carotid upstrokes bilaterally   LUNGS: no acc muscle  use,  Nl contour chest which is clear to A and P bilaterally without cough on insp or exp maneuvers   CV:  RRR  no s3 or murmur or increase in P2, and no edema  ABD:  Quite obes/ soft and nontender with nl inspiratory excursion in the supine position. No bruits or organomegaly appreciated, bowel sounds nl  MS:  Nl gait/ ext warm without deformities, calf tenderness, cyanosis or clubbing No obvious joint restrictions   SKIN: warm and dry without lesions    NEURO:  alert, approp, nl sensorium with  no motor or cerebellar deficits apparent.            Assessment

## 2020-02-12 NOTE — Progress Notes (Signed)
Careteam: Patient Care Team: Sharon Seller, NP as PCP - General (Geriatric Medicine)  PLACE OF SERVICE:  [facility]  Advanced Directive information    Allergies  Allergen Reactions  . Bactrim [Sulfamethoxazole-Trimethoprim]   . Keflex [Cephalexin]   . Omnicef [Cefdinir]   . Sulfa Antibiotics     Chief Complaint  Patient presents with  . Medical Management of Chronic Issues    Bruises are showing up. Right arm aching. Right leg vein flare up. Review medication for under stomach.     HPI: Patient is a 64 y.o. female for routine follow up.   Asthma- followed by pulmonary, saw earlier today. She is doing well. Breathing under control. Not needing rescue inhaler.   Vit D def- vit d level 71 on vit d 50,000 units daily  Reports bruises under arm and on her thigh, she applies a cream and then it is gone, plt counts normal.   Reports right arm has been aching for about 2 weeks, will be there for a few hours moves to neck and then it will be done. Reports it is tingling.  Pain 2-3/10. Has not tried to help the pain. Does not effect her on a day to day but aware of the pain.  Like a toothache.  No weakness or dropping things. No numbness or tingling.   Constipation- was placed on linzess, then started having diarrhea and then stopped medication. Now going regular daily.   GERD- controlled.   Uric acid at goal- no recent flares- used to could not close fingers but this has improved, continues on allopurinol 100 mg daily   Has had both COVID vaccine.   Continues with pain due to varicose veins which flares occasional.    Review of Systems:  Review of Systems  Constitutional: Negative for chills, fever and weight loss.  HENT: Negative for tinnitus.   Respiratory: Negative for cough, sputum production and shortness of breath.   Cardiovascular: Negative for chest pain, palpitations and leg swelling.  Gastrointestinal: Negative for abdominal pain, constipation,  diarrhea and heartburn.  Genitourinary: Negative for dysuria, frequency and urgency.  Musculoskeletal: Positive for neck pain. Negative for back pain, falls, joint pain and myalgias.  Skin: Negative.   Neurological: Negative for dizziness and headaches.  Psychiatric/Behavioral: Negative for depression and memory loss. The patient does not have insomnia.    Past Medical History:  Diagnosis Date  . Allergic rhinitis, unspecified   . Arthritis    Per [facility] new patient packet   . Asthma    Per [facility] new patient packet   . Gout, unspecified   . Hyperglycemia    Per labs completed on 03/15/2019 @ [facility]  . Hyperlipidemia   . Hypertension   . Irregular bowel habits    Per [facility] new patient packet   . Right knee injury    Stretched veins in right knee   . Upper respiratory infection   . Varicose vein of leg    Right, Per [facility] new patient packet   . Vitamin D deficiency    Past Surgical History:  Procedure Laterality Date  . TUBAL LIGATION  1990   [facility]    Social History:   reports that she has never smoked. She has never used smokeless tobacco. She reports previous alcohol use. She reports previous drug use.  Family History  Problem Relation Age of Onset  . High blood pressure Mother   . Hypertension Mother   . Heart disease Father   .  COPD Father   . Heart disease Brother     Medications: Patient's Medications  New Prescriptions   NYSTATIN CREAM (MYCOSTATIN)    Apply 1 application topically 2 (two) times daily as needed for dry skin.  Previous Medications   ACETAMINOPHEN (TYLENOL) 500 MG TABLET    Take 500 mg by mouth as needed.   ALBUTEROL (VENTOLIN HFA) 108 (90 BASE) MCG/ACT INHALER    Inhale 2 puffs into the lungs every 6 (six) hours as needed for wheezing or shortness of breath.   ALBUTEROL SULFATE 2.5 MG/0.5ML NEBU    Inhale into the lungs as needed.   ALLOPURINOL (ZYLOPRIM) 100 MG TABLET    Take 1 tablet (100 mg total) by mouth daily.   AMLODIPINE  (NORVASC) 5 MG TABLET    Take 1 tablet (5 mg total) by mouth daily.   AZELASTINE (OPTIVAR) 0.05 % OPHTHALMIC SOLUTION    Place 1 drop into both eyes 2 (two) times daily.   BUDESONIDE-FORMOTEROL (SYMBICORT) 80-4.5 MCG/ACT INHALER    Inhale 2 puffs into the lungs 2 (two) times daily.   ERGOCALCIFEROL (VITAMIN D2) 1.25 MG (50000 UT) CAPSULE    Take 1 capsule (50,000 Units total) by mouth once a week.   FAMOTIDINE (PEPCID) 20 MG TABLET    One after supper   FUROSEMIDE (LASIX) 40 MG TABLET    TAKE 1 AND 1/2 TABLETS BY MOUTH DAILY   METOPROLOL SUCCINATE (TOPROL-XL) 25 MG 24 HR TABLET    Take 1 tablet (25 mg total) by mouth daily.   MONTELUKAST (SINGULAIR) 10 MG TABLET    TAKE 1 TABLET(10 MG) BY MOUTH AT BEDTIME   NYSTATIN (MYCOSTATIN/NYSTOP) POWDER    Apply topically as needed.   OLMESARTAN (BENICAR) 20 MG TABLET    Take 1 tablet (20 mg total) by mouth daily.   PANTOPRAZOLE (PROTONIX) 40 MG TABLET    Take 1 tablet (40 mg total) by mouth daily. Take 30-60 min before first meal of the day   POTASSIUM CHLORIDE (MICRO-K) 10 MEQ CR CAPSULE    TAKE 1 CAPSULE(10 MEQ) BY MOUTH DAILY  Modified Medications   No medications on file  Discontinued Medications   CLOTRIMAZOLE-BETAMETHASONE (LOTRISONE) CREAM    Apply 1 application topically as needed. To stomach area    Physical Exam:  Vitals:   02/12/20 1303 02/12/20 1332  BP: (!) 158/98 138/84  Pulse: 92   Temp: 97.8 F (36.6 C)   SpO2: 97%   Weight: (!) 320 lb 3.2 oz (145.2 kg)   Height: 5\' 10"  (1.778 m)    Body mass index is 45.94 kg/m. Wt Readings from Last 3 Encounters:  02/12/20 (!) 320 lb 3.2 oz (145.2 kg)  02/12/20 (!) 320 lb (145.2 kg)  06/22/19 (!) 321 lb (145.6 kg)    Physical Exam Constitutional:      General: She is not in acute distress.    Appearance: She is well-developed. She is not diaphoretic.  HENT:     Head: Normocephalic and atraumatic.     Mouth/Throat:     Pharynx: No oropharyngeal exudate.  Eyes:      Conjunctiva/sclera: Conjunctivae normal.     Pupils: Pupils are equal, round, and reactive to light.  Neck:   Cardiovascular:     Rate and Rhythm: Normal rate and regular rhythm.     Heart sounds: Normal heart sounds.  Pulmonary:     Effort: Pulmonary effort is normal.     Breath sounds: Normal breath sounds.  Abdominal:  General: Bowel sounds are normal.     Palpations: Abdomen is soft.  Musculoskeletal:     Cervical back: Normal range of motion and neck supple. Muscular tenderness present. No pain with movement or spinous process tenderness.  Skin:    General: Skin is warm and dry.  Neurological:     Mental Status: She is alert and oriented to person, place, and time.     Labs reviewed: Basic Metabolic Panel: Recent Labs    03/15/19 0929 02/08/20 0819  NA 140 142  K 4.9 4.6  CL 103 107  CO2 29 28  GLUCOSE 110* 98  BUN 19 21  CREATININE 0.94 1.05*  CALCIUM 9.7 9.4   Liver Function Tests: Recent Labs    03/15/19 0929 02/08/20 0819  AST 19 19  ALT 14 14  BILITOT 0.8 0.7  PROT 7.2 6.9   No results for input(s): LIPASE, AMYLASE in the last 8760 hours. No results for input(s): AMMONIA in the last 8760 hours. CBC: Recent Labs    03/15/19 0929 06/02/19 1557 02/08/20 0819  WBC 3.8 5.4 4.5  NEUTROABS 1,421* 1,906 1,616  HGB 14.4 14.5 13.9  HCT 43.1 44.1 42.5  MCV 82.7 86.3 84.8  PLT 261 262 284   Lipid Panel: Recent Labs    03/15/19 0929 02/08/20 0819  CHOL 172 144  HDL 58 57  LDLCALC 94 73  TRIG 106 59  CHOLHDL 3.0 2.5   TSH: No results for input(s): TSH in the last 8760 hours. A1C: Lab Results  Component Value Date   HGBA1C 6.0 (H) 02/08/2020     Assessment/Plan 1. Vitamin D deficiency -vit D level 70, okay to stop vit d 50,000 units and start vit D 1000 units by mouth daly  2. Essential hypertension Improved on recheck. Also good at appt this morning. Pt reports she does get nervous about coming into the office. Will continue on  Norvasc 5 mg daily, almesartan 20 mg,  with metoprolol 25 mg daily   3. Chronic gout of right hand, unspecified cause Stable, uric acid level at goal on allopurinol 100 mg daily, continue medications as well as dietary modifications.   4. 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   5. Yeast infection of the skin - nystatin cream (MYCOSTATIN); Apply 1 application topically 2 (two) times daily as needed to dry skin for yeast.  Dispense: 30 g; Refill: 3  6. Varicose veins of both legs with edema -ongoing, encouraged weight loss, compression hose. Continues on lasix with potassium supplement for edema.  7. Hyperlipidemia, unspecified hyperlipidemia type -LDL 73, encouraged dietary modifications.   8. Hyperglycemia Stable at this time, continue to work on dietary modifications.   9. Cough variant asthma vs UACS/ vcd  Improved on symbicort.   10. Neck pain To use aleve 1 tablet twice daily for 5 days with food for neck pain To make sure you have a supportive pillow at night and proper posture when looking at computer.  To use heating pad to neck 3 times daily ~20 mins with muscle rub after heat. Notify if symptoms worsen or fail to improve.   Next appt: 6 months for routine follow up.  Carlos American. Cotter, [facility] [phone]

## 2020-02-13 ENCOUNTER — Encounter: Payer: Self-pay | Admitting: Internal Medicine

## 2020-02-13 NOTE — Assessment & Plan Note (Addendum)
Onset in her 72s maint on singulair  - 06/02/2019   try symbicort 80 2bid and max gerd rx  - Allergy profile  06/02/19 >  Eos 81/  IgE  97  RAST neg    - 02/12/2020  After extensive coaching inhaler device,  effectiveness =   75% (short Ti)    Despite suboptimal inspiration: All goals of chronic asthma control met including optimal function and elimination of symptoms with minimal need for rescue therap on symbicort 80 bid so ok to titrate 1-2 bid Based on two studies from NEJM  378; 20 p 1865 (2018) and 380 : p2020-30 (2019) in pts with mild asthma it is reasonable to use low dose symbicort floor of 1 bid and ceiling of 2 bid  but I emphasized this was only shown with symbicort and takes advantage of the rapid onset of action of the formoterol component.   Contingencies discussed in full including contacting this office immediately if not controlling the symptoms using the rule of two's.     F/u  q 12 months

## 2020-02-13 NOTE — Assessment & Plan Note (Signed)
Body mass index is 45.92 kg/m.  -  trending no change  Lab Results  Component Value Date   TSH 4.11 07/13/2016     Contributing to gerd risk/ doe/reviewed the need and the process to achieve and maintain neg calorie balance > defer f/u primary care including intermittently monitoring thyroid status            Each maintenance medication was reviewed in detail including emphasizing most importantly the difference between maintenance and prns and under what circumstances the prns are to be triggered using an action plan format where appropriate.  Total time for H and P, chart review, counseling, teaching device and generating customized AVS unique to this office visit / charting = 20 min

## 2020-02-14 ENCOUNTER — Telehealth: Payer: Self-pay

## 2020-02-14 LAB — COLOGUARD
COLOGUARD: NEGATIVE
Cologuard: NEGATIVE

## 2020-02-14 NOTE — Telephone Encounter (Signed)
April, daughter notified and agreed.

## 2020-02-14 NOTE — Telephone Encounter (Signed)
Patient was called to be notified of Negative Cologuard report. Patient didn't answer the phone. Voicemail was left with office call back number.  

## 2020-04-15 ENCOUNTER — Telehealth: Payer: Self-pay | Admitting: Internal Medicine

## 2020-04-15 DIAGNOSIS — J45991 Cough variant asthma: Secondary | ICD-10-CM

## 2020-04-15 MED ORDER — BUDESONIDE-FORMOTEROL FUMARATE 80-4.5 MCG/ACT IN AERO: 2.0000 | INHALATION_SPRAY | Freq: Two times a day (BID) | RESPIRATORY_TRACT | 3 refills | Status: DC

## 2020-04-15 NOTE — Telephone Encounter (Signed)
Rx for symbicort has been sent to pharmacy for pt. Called pt's  Pharmacy and spoke with Malachi Bonds letting her know this was done and she verbalized understanding. Nothing further needed.

## 2020-04-16 ENCOUNTER — Other Ambulatory Visit: Payer: Self-pay | Admitting: Nurse Practitioner

## 2020-04-16 ENCOUNTER — Other Ambulatory Visit: Payer: Self-pay

## 2020-04-16 DIAGNOSIS — I10 Essential (primary) hypertension: Secondary | ICD-10-CM

## 2020-04-16 DIAGNOSIS — M1A0410 Idiopathic chronic gout, right hand, without tophus (tophi): Secondary | ICD-10-CM

## 2020-04-16 DIAGNOSIS — I83893 Varicose veins of bilateral lower extremities with other complications: Secondary | ICD-10-CM

## 2020-04-16 MED ORDER — MONTELUKAST SODIUM 10 MG PO TABS: 10.0000 mg | ORAL_TABLET | Freq: Every day | ORAL | 0 refills | Status: DC

## 2020-04-16 MED ORDER — POTASSIUM CHLORIDE ER 10 MEQ PO CPCR: 10.0000 meq | ORAL_CAPSULE | Freq: Every day | ORAL | 0 refills | Status: DC

## 2020-04-16 MED ORDER — OLMESARTAN MEDOXOMIL 20 MG PO TABS: 20.0000 mg | ORAL_TABLET | Freq: Every day | ORAL | 0 refills | Status: DC

## 2020-04-16 MED ORDER — AZELASTINE HCL 0.05 % OP SOLN: 1.0000 [drp] | Freq: Two times a day (BID) | OPHTHALMIC | 0 refills | Status: DC

## 2020-04-16 MED ORDER — ALBUTEROL SULFATE HFA 108 (90 BASE) MCG/ACT IN AERS: 2.0000 | INHALATION_SPRAY | Freq: Four times a day (QID) | RESPIRATORY_TRACT | 0 refills | Status: DC | PRN

## 2020-04-16 MED ORDER — AMLODIPINE BESYLATE 5 MG PO TABS: 5.0000 mg | ORAL_TABLET | Freq: Every day | ORAL | 0 refills | Status: DC

## 2020-04-16 MED ORDER — FUROSEMIDE 40 MG PO TABS: ORAL_TABLET | ORAL | 0 refills | Status: DC

## 2020-04-16 MED ORDER — ALBUTEROL SULFATE 2.5 MG/0.5ML IN NEBU: 1.0000 | INHALATION_SOLUTION | RESPIRATORY_TRACT | 0 refills | Status: AC | PRN

## 2020-04-16 MED ORDER — VITAMIN D (CHOLECALCIFEROL) 25 MCG (1000 UT) PO CAPS: 1000.0000 [IU] | ORAL_CAPSULE | Freq: Every day | ORAL | 0 refills | Status: DC

## 2020-04-16 MED ORDER — ALLOPURINOL 100 MG PO TABS: 100.0000 mg | ORAL_TABLET | Freq: Every day | ORAL | 1 refills | Status: DC

## 2020-04-16 MED ORDER — METOPROLOL SUCCINATE ER 25 MG PO TB24: 25.0000 mg | ORAL_TABLET | Freq: Every day | ORAL | 0 refills | Status: DC

## 2020-04-16 NOTE — Addendum Note (Signed)
Addended by: Karin Lieu T on: 04/16/2020 08:07 AM   Modules accepted: Orders

## 2020-04-16 NOTE — Telephone Encounter (Signed)
[facility] called and stated that patient needed refills on all medications prescribed by [facility].

## 2020-04-18 ENCOUNTER — Other Ambulatory Visit: Payer: Self-pay

## 2020-04-18 DIAGNOSIS — J45991 Cough variant asthma: Secondary | ICD-10-CM

## 2020-04-18 MED ORDER — FAMOTIDINE 20 MG PO TABS: ORAL_TABLET | ORAL | 3 refills | Status: DC

## 2020-06-28 ENCOUNTER — Other Ambulatory Visit: Payer: Self-pay | Admitting: Internal Medicine

## 2020-06-28 DIAGNOSIS — J45991 Cough variant asthma: Secondary | ICD-10-CM

## 2020-07-15 ENCOUNTER — Other Ambulatory Visit: Payer: Self-pay | Admitting: Nurse Practitioner

## 2020-07-15 DIAGNOSIS — I10 Essential (primary) hypertension: Secondary | ICD-10-CM

## 2020-08-07 ENCOUNTER — Other Ambulatory Visit: Payer: Self-pay | Admitting: Nurse Practitioner

## 2020-08-07 ENCOUNTER — Other Ambulatory Visit: Payer: Self-pay | Admitting: Internal Medicine

## 2020-08-07 ENCOUNTER — Encounter: Payer: Self-pay | Admitting: *Deleted

## 2020-08-07 DIAGNOSIS — J45991 Cough variant asthma: Secondary | ICD-10-CM

## 2020-08-12 ENCOUNTER — Ambulatory Visit (INDEPENDENT_AMBULATORY_CARE_PROVIDER_SITE_OTHER): Payer: Medicare Other | Admitting: Nurse Practitioner

## 2020-08-12 ENCOUNTER — Other Ambulatory Visit: Payer: Self-pay

## 2020-08-12 ENCOUNTER — Encounter: Payer: Self-pay | Admitting: Nurse Practitioner

## 2020-08-12 VITALS — BP 148/90 | HR 92 | Temp 97.5°F | Ht 70.0 in | Wt 315.0 lb

## 2020-08-12 DIAGNOSIS — J302 Other seasonal allergic rhinitis: Secondary | ICD-10-CM

## 2020-08-12 DIAGNOSIS — I10 Essential (primary) hypertension: Secondary | ICD-10-CM | POA: Diagnosis not present

## 2020-08-12 DIAGNOSIS — M1A0410 Idiopathic chronic gout, right hand, without tophus (tophi): Secondary | ICD-10-CM | POA: Diagnosis not present

## 2020-08-12 DIAGNOSIS — M542 Cervicalgia: Secondary | ICD-10-CM

## 2020-08-12 DIAGNOSIS — B372 Candidiasis of skin and nail: Secondary | ICD-10-CM

## 2020-08-12 DIAGNOSIS — Z23 Encounter for immunization: Secondary | ICD-10-CM | POA: Diagnosis not present

## 2020-08-12 DIAGNOSIS — J45991 Cough variant asthma: Secondary | ICD-10-CM

## 2020-08-12 DIAGNOSIS — R739 Hyperglycemia, unspecified: Secondary | ICD-10-CM | POA: Diagnosis not present

## 2020-08-12 DIAGNOSIS — E785 Hyperlipidemia, unspecified: Secondary | ICD-10-CM

## 2020-08-12 DIAGNOSIS — K5903 Drug induced constipation: Secondary | ICD-10-CM

## 2020-08-12 MED ORDER — NYSTATIN 100000 UNIT/GM EX CREA: 1.0000 "application " | TOPICAL_CREAM | Freq: Two times a day (BID) | CUTANEOUS | 3 refills | Status: DC | PRN

## 2020-08-12 NOTE — Patient Instructions (Addendum)
Start zyrtec (genetic) 10 mg daily  Can use a nasal wash daily as well.

## 2020-08-12 NOTE — Progress Notes (Signed)
Careteam: Patient Care Team: Lauree Chandler, NP as PCP - General (Geriatric Medicine)  PLACE OF SERVICE:  [place] Directive information Does Patient Have a Medical Advance Directive?: No, Would patient like information on creating a medical advance directive?: No - Patient declined  Allergies  Allergen Reactions  . Bactrim [Sulfamethoxazole-Trimethoprim]   . Keflex [Cephalexin]   . Omnicef [Cefdinir]   . Sulfa Antibiotics     Chief Complaint  Patient presents with  . Medical Management of Chronic Issues    6 month follow-up. Patient c/o palpitations in neck in July x 2 and once in August. Patient felt strange x 2 hours after episodes. Discuss need for colonocscopy, PNA, and dexa.       HPI: Patient is a 64 y.o. female for routine follow up  Reports in July she had a palpitation/muscle spasm in neck, small episodes. In august happened again, she reports she could see her neck moving but when she pressed against area it stopped and has not happened since. No pain.  No trouble with swallowing, breathing.  No trouble with moving the neck.  No palpitations in her chest or chest pains.   She did have prior neck pain but this has improved.   Would like to wait on dexa until next mammogram due.   Fasting this morning- did not take her medication  htn- did not take meds today, generally bp down in the 110s  Gout- no recent flares   Having more allergies- pharmacy made some recommendation. Does not seem to be helping.   Review of Systems:  Review of Systems  Constitutional: Negative for chills and fever.  HENT: Negative for tinnitus.   Respiratory: Negative for cough, sputum production and shortness of breath.   Cardiovascular: Negative for chest pain, palpitations and leg swelling.  Gastrointestinal: Positive for constipation (after starting OTC). Negative for abdominal pain, diarrhea and heartburn.  Genitourinary: Negative for dysuria, frequency and  urgency.  Musculoskeletal: Negative for back pain, falls, joint pain and myalgias.  Skin: Negative.   Neurological: Negative for dizziness and headaches.  Psychiatric/Behavioral: Negative for depression and memory loss. The patient does not have insomnia.     Past Medical History:  Diagnosis Date  . Allergic rhinitis, unspecified   . Arthritis    Per [place] new patient packet   . Asthma    Per [place] new patient packet   . Gout, unspecified   . Hyperglycemia    Per labs completed on 03/15/2019 @ [place]  . Hyperlipidemia   . Hypertension   . Irregular bowel habits    Per [place] new patient packet   . Right knee injury    Stretched veins in right knee   . Upper respiratory infection   . Varicose vein of leg    Right, Per [facility] new patient packet   . Vitamin D deficiency    Past Surgical History:  Procedure Laterality Date  . [facility]    Social History:   reports that she has never smoked. She has never used smokeless tobacco. She reports previous alcohol use. She reports previous drug use.  Family History  Problem Relation Age of Onset  . High blood pressure Mother   . Hypertension Mother   . Heart disease Father   . COPD Father   . Heart disease Brother     Medications: Patient's Medications  New Prescriptions   No medications on file  Previous  Medications   ACETAMINOPHEN (TYLENOL) 500 MG TABLET    Take 500 mg by mouth as needed.   ALBUTEROL (VENTOLIN HFA) 108 (90 BASE) MCG/ACT INHALER    Inhale 2 puffs into the lungs every 6 (six) hours as needed for wheezing or shortness of breath.   ALBUTEROL SULFATE 2.5 MG/0.5ML NEBU    Inhale 0.5 mLs (2.5 mg total) into the lungs as needed.   ALLOPURINOL (ZYLOPRIM) 100 MG TABLET    Take 1 tablet (100 mg total) by mouth daily.   AMLODIPINE (NORVASC) 5 MG TABLET    Take 1 tablet (5 mg total) by mouth daily.   AZELASTINE (OPTIVAR) 0.05 % OPHTHALMIC SOLUTION    Place 1 drop into both eyes 2 (two) times  daily.   BUDESONIDE-FORMOTEROL (SYMBICORT) 80-4.5 MCG/ACT INHALER    INHALE 2 PUFFS INTO THE LUNGS TWICE DAILY   FAMOTIDINE (PEPCID) 20 MG TABLET    One after supper   FUROSEMIDE (LASIX) 40 MG TABLET    TAKE 1 AND 1/2 TABLETS BY MOUTH DAILY   METOPROLOL SUCCINATE (TOPROL-XL) 25 MG 24 HR TABLET    TAKE 1 TABLET(25 MG) BY MOUTH DAILY   MONTELUKAST (SINGULAIR) 10 MG TABLET    TAKE 1 TABLET(10 MG) BY MOUTH AT BEDTIME   NYSTATIN (MYCOSTATIN/NYSTOP) POWDER    Apply topically as needed.   NYSTATIN CREAM (MYCOSTATIN)    Apply 1 application topically 2 (two) times daily as needed for dry skin.   OLMESARTAN (BENICAR) 20 MG TABLET    Take 1 tablet (20 mg total) by mouth daily.   PANTOPRAZOLE (PROTONIX) 40 MG TABLET    TAKE 1 TABLET(40 MG) BY MOUTH DAILY 30 TO 60 MINUTES BEFORE FIRST MEAL OF THE DAY   POTASSIUM CHLORIDE (MICRO-K) 10 MEQ CR CAPSULE    Take 1 capsule (10 mEq total) by mouth daily.   VITAMIN D, CHOLECALCIFEROL, 25 MCG (1000 UT) CAPS    Take 1,000 Units by mouth daily.  Modified Medications   No medications on file  Discontinued Medications   No medications on file    Physical Exam:  Vitals:   08/12/20 0931  BP: (!) 148/90  Pulse: 92  Temp: (!) 97.5 F (36.4 C)  TempSrc: Temporal  SpO2: 99%  Weight: (!) 315 lb (142.9 kg)  Height: '5\' 10"'  (1.778 m)   Body mass index is 45.2 kg/m. Wt Readings from Last 3 Encounters:  08/12/20 (!) 315 lb (142.9 kg)  02/12/20 (!) 320 lb 3.2 oz (145.2 kg)  02/12/20 (!) 320 lb (145.2 kg)    Physical Exam Constitutional:      General: She is not in acute distress.    Appearance: She is well-developed. She is not diaphoretic.  HENT:     Head: Normocephalic and atraumatic.     Mouth/Throat:     Pharynx: No oropharyngeal exudate.  Eyes:     Conjunctiva/sclera: Conjunctivae normal.     Pupils: Pupils are equal, round, and reactive to light.  Cardiovascular:     Rate and Rhythm: Normal rate and regular rhythm.     Heart sounds: Normal heart  sounds.  Pulmonary:     Effort: Pulmonary effort is normal.     Breath sounds: Normal breath sounds.  Abdominal:     General: Bowel sounds are normal.     Palpations: Abdomen is soft.  Musculoskeletal:        General: No tenderness.     Cervical back: Normal range of motion and neck supple.  Skin:  General: Skin is warm and dry.  Neurological:     Mental Status: She is alert and oriented to person, place, and time.  Psychiatric:        Mood and Affect: Mood normal.        Behavior: Behavior normal.     Labs reviewed: Basic Metabolic Panel: Recent Labs    02/08/20 0819  NA 142  K 4.6  CL 107  CO2 28  GLUCOSE 98  BUN 21  CREATININE 1.05*  CALCIUM 9.4   Liver Function Tests: Recent Labs    02/08/20 0819  AST 19  ALT 14  BILITOT 0.7  PROT 6.9   No results for input(s): LIPASE, AMYLASE in the last 8760 hours. No results for input(s): AMMONIA in the last 8760 hours. CBC: Recent Labs    02/08/20 0819  WBC 4.5  NEUTROABS 1,616  HGB 13.9  HCT 42.5  MCV 84.8  PLT 284   Lipid Panel: Recent Labs    02/08/20 0819  CHOL 144  HDL 57  LDLCALC 73  TRIG 59  CHOLHDL 2.5   TSH: No results for input(s): TSH in the last 8760 hours. A1C: Lab Results  Component Value Date   HGBA1C 6.0 (H) 02/08/2020     Assessment/Plan 1. Essential hypertension -generally well controlled, did not take medication prior to office visit.  - CMP with eGFR([facility]) - CBC with Differential/Platelet  2. Chronic gout of right hand, unspecified cause -no recent flares, continues on allopurinol 100 mg daily. - Uric Acid  3. Obesity, morbid (more than 100 lbs over ideal weight or BMI > 40) (HCC) Ongoing, encouraged weight loss through diet and increase in physical activity.   4. Hyperlipidemia, unspecified hyperlipidemia type -stable on last lab, continue dietary modification  - CMP with eGFR([facility])  5. Hyperglycemia Encourage dietary modifications.  - Hemoglobin A1c -  Pneumococcal conjugate vaccine 13-valent  6. 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 Continues on Singulair daily. Has albuterol PRN  7. Yeast infection of the skin - nystatin cream (MYCOSTATIN); Apply 1 application topically 2 (two) times daily as needed. For yeast  Dispense: 30 g; Refill: 3  8. Need for vaccination with 13-polyvalent pneumococcal conjugate vaccine - Pneumococcal conjugate vaccine 13-valent  9. Neck pain Has resolved, had episode of spasm but this has now resolved. To notify if symptoms worsen.   10. Drug-induced constipation Taking OTC benadryl which has increased constipation, to stop benadryl at this time.  11. Seasonal allergies To stop benadryl, start OTC zyrtec 10 mg daily.    Next appt: 6 months.  Carlos American. [place], [facility] [phone]

## 2020-08-13 LAB — CBC WITH DIFFERENTIAL/PLATELET
Absolute Monocytes: 367 cells/uL (ref 200–950)
Basophils Absolute: 31 cells/uL (ref 0–200)
Basophils Relative: 0.9 %
Eosinophils Absolute: 51 cells/uL (ref 15–500)
Eosinophils Relative: 1.5 %
HCT: 45.3 % — ABNORMAL HIGH (ref 35.0–45.0)
Hemoglobin: 14.9 g/dL (ref 11.7–15.5)
Lymphs Abs: 1659 cells/uL (ref 850–3900)
MCH: 28.4 pg (ref 27.0–33.0)
MCHC: 32.9 g/dL (ref 32.0–36.0)
MCV: 86.3 fL (ref 80.0–100.0)
MPV: 10.4 fL (ref 7.5–12.5)
Monocytes Relative: 10.8 %
Neutro Abs: 1292 cells/uL — ABNORMAL LOW (ref 1500–7800)
Neutrophils Relative %: 38 %
Platelets: 269 10*3/uL (ref 140–400)
RBC: 5.25 10*6/uL — ABNORMAL HIGH (ref 3.80–5.10)
RDW: 13 % (ref 11.0–15.0)
Total Lymphocyte: 48.8 %
WBC: 3.4 10*3/uL — ABNORMAL LOW (ref 3.8–10.8)

## 2020-08-13 LAB — HEMOGLOBIN A1C
Hgb A1c MFr Bld: 6 % of total Hgb — ABNORMAL HIGH (ref <5.7)
Mean Plasma Glucose: 126 (calc)
eAG (mmol/L): 7 (calc)

## 2020-08-13 LAB — COMPLETE METABOLIC PANEL WITH GFR
AG Ratio: 1.3 (calc) (ref 1.0–2.5)
ALT: 16 U/L (ref 6–29)
AST: 21 U/L (ref 10–35)
Albumin: 4 g/dL (ref 3.6–5.1)
Alkaline phosphatase (APISO): 76 U/L (ref 37–153)
BUN/Creatinine Ratio: 14 (calc) (ref 6–22)
BUN: 16 mg/dL (ref 7–25)
CO2: 30 mmol/L (ref 20–32)
Calcium: 9.5 mg/dL (ref 8.6–10.4)
Chloride: 102 mmol/L (ref 98–110)
Creat: 1.12 mg/dL — ABNORMAL HIGH (ref 0.50–0.99)
GFR, Est African American: 60 mL/min/{1.73_m2} (ref >=60)
GFR, Est Non African American: 52 mL/min/{1.73_m2} — ABNORMAL LOW (ref >=60)
Globulin: 3.1 g/dL (calc) (ref 1.9–3.7)
Glucose, Bld: 107 mg/dL — ABNORMAL HIGH (ref 65–99)
Potassium: 4.6 mmol/L (ref 3.5–5.3)
Sodium: 139 mmol/L (ref 135–146)
Total Bilirubin: 1.1 mg/dL (ref 0.2–1.2)
Total Protein: 7.1 g/dL (ref 6.1–8.1)

## 2020-08-13 LAB — URIC ACID: Uric Acid, Serum: 5.2 mg/dL (ref 2.5–7.0)

## 2020-08-15 ENCOUNTER — Encounter: Payer: Self-pay | Admitting: Nurse Practitioner

## 2020-09-30 ENCOUNTER — Other Ambulatory Visit: Payer: Self-pay | Admitting: Nurse Practitioner

## 2020-09-30 ENCOUNTER — Encounter: Payer: Self-pay | Admitting: Nurse Practitioner

## 2020-09-30 DIAGNOSIS — I83893 Varicose veins of bilateral lower extremities with other complications: Secondary | ICD-10-CM

## 2020-09-30 DIAGNOSIS — I10 Essential (primary) hypertension: Secondary | ICD-10-CM

## 2020-10-19 ENCOUNTER — Other Ambulatory Visit: Payer: Self-pay

## 2020-10-19 ENCOUNTER — Ambulatory Visit
Admission: EM | Admit: 2020-10-19 | Discharge: 2020-10-19 | Disposition: A | Discharge status: Home / Self Care | Payer: Commercial Managed Care - PPO | Attending: Emergency Medicine | Admitting: Emergency Medicine

## 2020-10-19 DIAGNOSIS — H6981 Other specified disorders of Eustachian tube, right ear: Secondary | ICD-10-CM

## 2020-10-19 DIAGNOSIS — R42 Dizziness and giddiness: Secondary | ICD-10-CM

## 2020-10-19 DIAGNOSIS — H6121 Impacted cerumen, right ear: Secondary | ICD-10-CM

## 2020-10-19 NOTE — ED Provider Notes (Signed)
[facility]    CSN: [id number] Arrival date & time: 10/19/20  1446      History   Chief Complaint Chief Complaint  Patient presents with  . Dizziness  . Nausea    HPI Traci Gomez is a 65 y.o. female  With history as below presenting for dizziness, right ear plugging sensation.  States it began around Christmas.  Did have some nausea, though denies that currently.  No vomiting.  Takes meclizine, Flonase with relief as she has history of this.  Patient denies tinnitus, dizziness, chest pain or cough.  Past Medical History:  Diagnosis Date  . Allergic rhinitis, unspecified   . Arthritis    Per [place] new patient packet   . Asthma    Per [place] new patient packet   . Gout, unspecified   . Hyperglycemia    Per labs completed on 03/15/2019 @ [place]  . Hyperlipidemia   . Hypertension   . Irregular bowel habits    Per [place] new patient packet   . Right knee injury    Stretched veins in right knee   . Upper respiratory infection   . Varicose vein of leg    Right, Per [facility] new patient packet   . Vitamin D deficiency     Patient Active Problem List   Diagnosis Date Noted  . Morbid (severe) obesity due to excess calories ([place]) 06/03/2019  . Vitamin D deficiency   . Gout, unspecified   . Hyperglycemia   . 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 Arthritis   . Hyperlipidemia   . Hypertension     Past Surgical History:  Procedure Laterality Date  . [facility]     OB History   No obstetric history on file.      Home Medications    Prior to Admission medications   Medication Sig Start Date End Date Taking? Authorizing Provider  acetaminophen (TYLENOL) 500 MG tablet Take 500 mg by mouth as needed.    [provider]  albuterol (VENTOLIN HFA) 108 (90 Base) MCG/ACT inhaler Inhale 2 puffs into the lungs every 6 (six) hours as needed for wheezing or shortness of breath. 04/16/20   Lauree Chandler, NP  Albuterol Sulfate  2.5 MG/0.5ML NEBU Inhale 0.5 mLs (2.5 mg total) into the lungs as needed. 04/16/20   Lauree Chandler, NP  allopurinol (ZYLOPRIM) 100 MG tablet Take 1 tablet (100 mg total) by mouth daily. 04/16/20   Lauree Chandler, NP  amLODipine (NORVASC) 5 MG tablet TAKE 1 TABLET(5 MG) BY MOUTH DAILY 09/30/20   Lauree Chandler, NP  azelastine (OPTIVAR) 0.05 % ophthalmic solution Place 1 drop into both eyes 2 (two) times daily. 04/16/20   Lauree Chandler, NP  budesonide-formoterol (SYMBICORT) 80-4.5 MCG/ACT inhaler INHALE 2 PUFFS INTO THE LUNGS TWICE DAILY 08/07/20   Tanda Rockers, MD  famotidine (PEPCID) 20 MG tablet One after supper 04/18/20   Tanda Rockers, MD  furosemide (LASIX) 40 MG tablet TAKE 1 AND 1/2 TABLETS BY MOUTH DAILY 09/30/20   Lauree Chandler, NP  metoprolol succinate (TOPROL-XL) 25 MG 24 hr tablet TAKE 1 TABLET(25 MG) BY MOUTH DAILY 09/30/20   Lauree Chandler, NP  montelukast (SINGULAIR) 10 MG tablet TAKE 1 TABLET(10 MG) BY MOUTH AT BEDTIME 08/07/20   Lauree Chandler, NP  nystatin (MYCOSTATIN/NYSTOP) powder Apply topically as needed.    [provider]  nystatin cream (  MYCOSTATIN) Apply 1 application topically 2 (two) times daily as needed. For yeast 08/12/20   Sharon Seller, NP  olmesartan (BENICAR) 20 MG tablet TAKE 1 TABLET(20 MG) BY MOUTH DAILY 09/30/20   Sharon Seller, NP  pantoprazole (PROTONIX) 40 MG tablet TAKE 1 TABLET(40 MG) BY MOUTH DAILY 30 TO 60 MINUTES BEFORE FIRST MEAL OF THE DAY 06/28/20   Nyoka Cowden, MD  potassium chloride (MICRO-K) 10 MEQ CR capsule Take 1 capsule (10 mEq total) by mouth daily. 04/16/20   Sharon Seller, NP  Vitamin D, Cholecalciferol, 25 MCG (1000 UT) CAPS Take 1,000 Units by mouth daily. 04/16/20   Sharon Seller, NP    Family History Family History  Problem Relation Age of Onset  . High blood pressure Mother   . Hypertension Mother   . Heart disease Father   . COPD Father   . Heart disease Brother      Social History Social History   Tobacco Use  . Smoking status: Never Smoker  . Smokeless tobacco: Never Used  Vaping Use  . Vaping Use: Never used  Substance Use Topics  . Alcohol use: Not Currently  . Drug use: Not Currently     Allergies   Bactrim [sulfamethoxazole-trimethoprim], Keflex [cephalexin], Omnicef [cefdinir], and Sulfa antibiotics   Review of Systems Review of Systems  Constitutional: Negative for fatigue and fever.  HENT: Positive for ear pain. Negative for congestion, dental problem, ear discharge, facial swelling, hearing loss, sinus pain, sore throat, trouble swallowing and voice change.   Eyes: Negative for photophobia, pain and visual disturbance.  Respiratory: Negative for cough and shortness of breath.   Cardiovascular: Negative for chest pain and palpitations.  Gastrointestinal: Negative for diarrhea and vomiting.  Musculoskeletal: Negative for arthralgias and myalgias.  Neurological: Positive for dizziness. Negative for headaches.     Physical Exam Triage Vital Signs ED Triage Vitals  Enc Vitals Group     BP --      Pulse --      Resp --      Temp --      Temp Source 10/19/20 1553 Oral     SpO2 --      Weight --      Height --      Head Circumference --      Peak Flow --      Pain Score 10/19/20 1552 0     Pain Loc --      Pain Edu? --      Excl. in GC? --    No data found.  Updated Vital Signs There were no vitals taken for this visit.  Visual Acuity Right Eye Distance:   Left Eye Distance:   Bilateral Distance:    Right Eye Near:   Left Eye Near:    Bilateral Near:     Physical Exam Constitutional:      General: She is not in acute distress. HENT:     Head: Normocephalic and atraumatic.     Jaw: There is normal jaw occlusion. No tenderness or pain on movement.     Right Ear: Hearing normal. No tenderness. There is impacted cerumen. No mastoid tenderness.     Left Ear: Hearing, tympanic membrane, ear canal and  external ear normal. No tenderness. No mastoid tenderness.     Ears:     Comments: S/p irrigation: Right ear unremarkable.    Nose: No nasal deformity, septal deviation or nasal tenderness.     Right  Turbinates: Not swollen or pale.     Left Turbinates: Not swollen or pale.     Right Sinus: No maxillary sinus tenderness or frontal sinus tenderness.     Left Sinus: No maxillary sinus tenderness or frontal sinus tenderness.     Mouth/Throat:     Lips: Pink. No lesions.     Mouth: Mucous membranes are moist. No injury.     Pharynx: Oropharynx is clear. Uvula midline. No posterior oropharyngeal erythema or uvula swelling.     Comments: no tonsillar exudate or hypertrophy Eyes:     General: No scleral icterus.    Extraocular Movements: Extraocular movements intact.     Conjunctiva/sclera: Conjunctivae normal.     Pupils: Pupils are equal, round, and reactive to light.  Cardiovascular:     Rate and Rhythm: Normal rate.  Pulmonary:     Effort: Pulmonary effort is normal. No respiratory distress.     Breath sounds: No wheezing.  Musculoskeletal:        General: No deformity. Normal range of motion.     Cervical back: Normal range of motion and neck supple. No rigidity or tenderness. No muscular tenderness.  Lymphadenopathy:     Cervical: No cervical adenopathy.  Skin:    Capillary Refill: Capillary refill takes less than 2 seconds.     Coloration: Skin is not jaundiced.     Findings: No bruising or rash.  Neurological:     General: No focal deficit present.     Mental Status: She is alert and oriented to person, place, and time.     Cranial Nerves: Cranial nerves are intact.     Sensory: Sensation is intact.     Motor: Motor function is intact.     Coordination: Coordination is intact.     Gait: Gait is intact.  Psychiatric:        Mood and Affect: Mood normal.        Behavior: Behavior normal.      UC Treatments / Results  Labs (all labs ordered are listed, but only abnormal  results are displayed) Labs Reviewed - No data to display  EKG   Radiology No results found.  Procedures Procedures (including critical care time)  Medications Ordered in UC Medications - No data to display  Initial Impression / Assessment and Plan / UC Course  I have reviewed the triage vital signs and the nursing notes.  Pertinent labs & imaging results that were available during my care of the patient were reviewed by me and considered in my medical decision making (see chart for details).     Patient febrile, nontoxic in office today.  No neurocognitive deficits.  Patient has history of eustachian tube dysfunction, vertigo, cerumen impaction.  Irrigation performed in office: Repeat exam benign.  We will continue supportive care, follow-up with PCP if needed.  Low concern for acute process given chronicity with nonfocal findings.  ER return precautions discussed, pt verbalized understanding and is agreeable to plan. Final Clinical Impressions(s) / UC Diagnoses   Final diagnoses:  Impacted cerumen, right ear  Dysfunction of right eustachian tube  Dizziness   Discharge Instructions   None    ED Prescriptions    None     PDMP not reviewed this encounter.   [place], [place], [place] 10/19/20 1638

## 2020-10-19 NOTE — Discharge Instructions (Addendum)
Debrox OTC: 3 drops in ears 3 times a week

## 2020-10-19 NOTE — ED Triage Notes (Signed)
Pt is here with dizziness and nausea that started 10/12/2020, pt has OTC meds and Bonine to relieve discomfort

## 2020-10-30 ENCOUNTER — Other Ambulatory Visit: Payer: Self-pay | Admitting: Nurse Practitioner

## 2020-11-29 ENCOUNTER — Other Ambulatory Visit: Payer: Self-pay | Admitting: Nurse Practitioner

## 2020-11-29 DIAGNOSIS — I10 Essential (primary) hypertension: Secondary | ICD-10-CM

## 2020-11-29 DIAGNOSIS — I83893 Varicose veins of bilateral lower extremities with other complications: Secondary | ICD-10-CM

## 2020-12-03 ENCOUNTER — Other Ambulatory Visit: Payer: Self-pay | Admitting: Nurse Practitioner

## 2020-12-03 DIAGNOSIS — M1A0410 Idiopathic chronic gout, right hand, without tophus (tophi): Secondary | ICD-10-CM

## 2020-12-04 ENCOUNTER — Encounter: Payer: Self-pay | Admitting: Nurse Practitioner

## 2021-01-12 IMAGING — DX CHEST - 2 VIEW
2 series · 2 of 2 positions shown · non-contrast
Comparison: None.

CLINICAL DATA: Cough.  Asthma.  Hypertension.

EXAM:
CHEST - 2 VIEW

[chest pa]
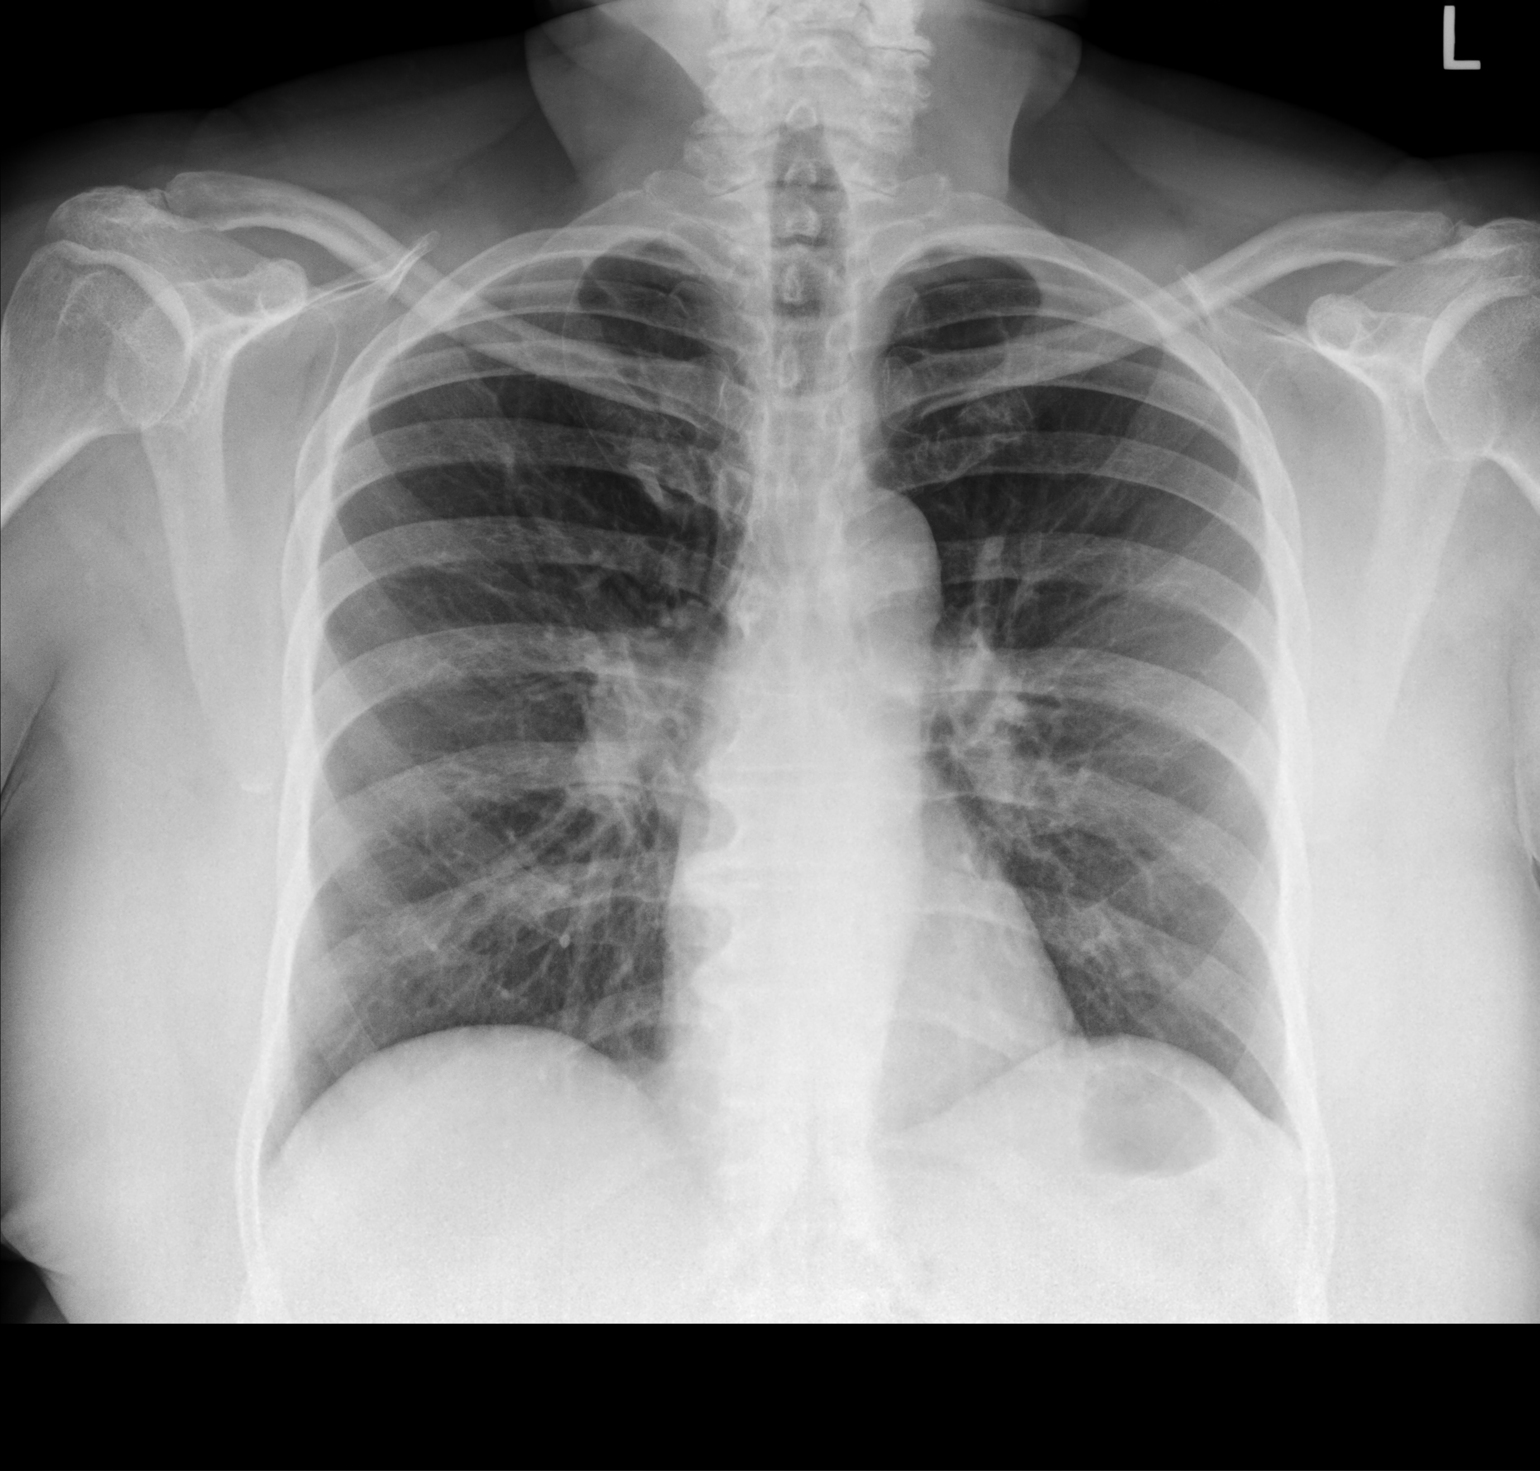

[chest lat]
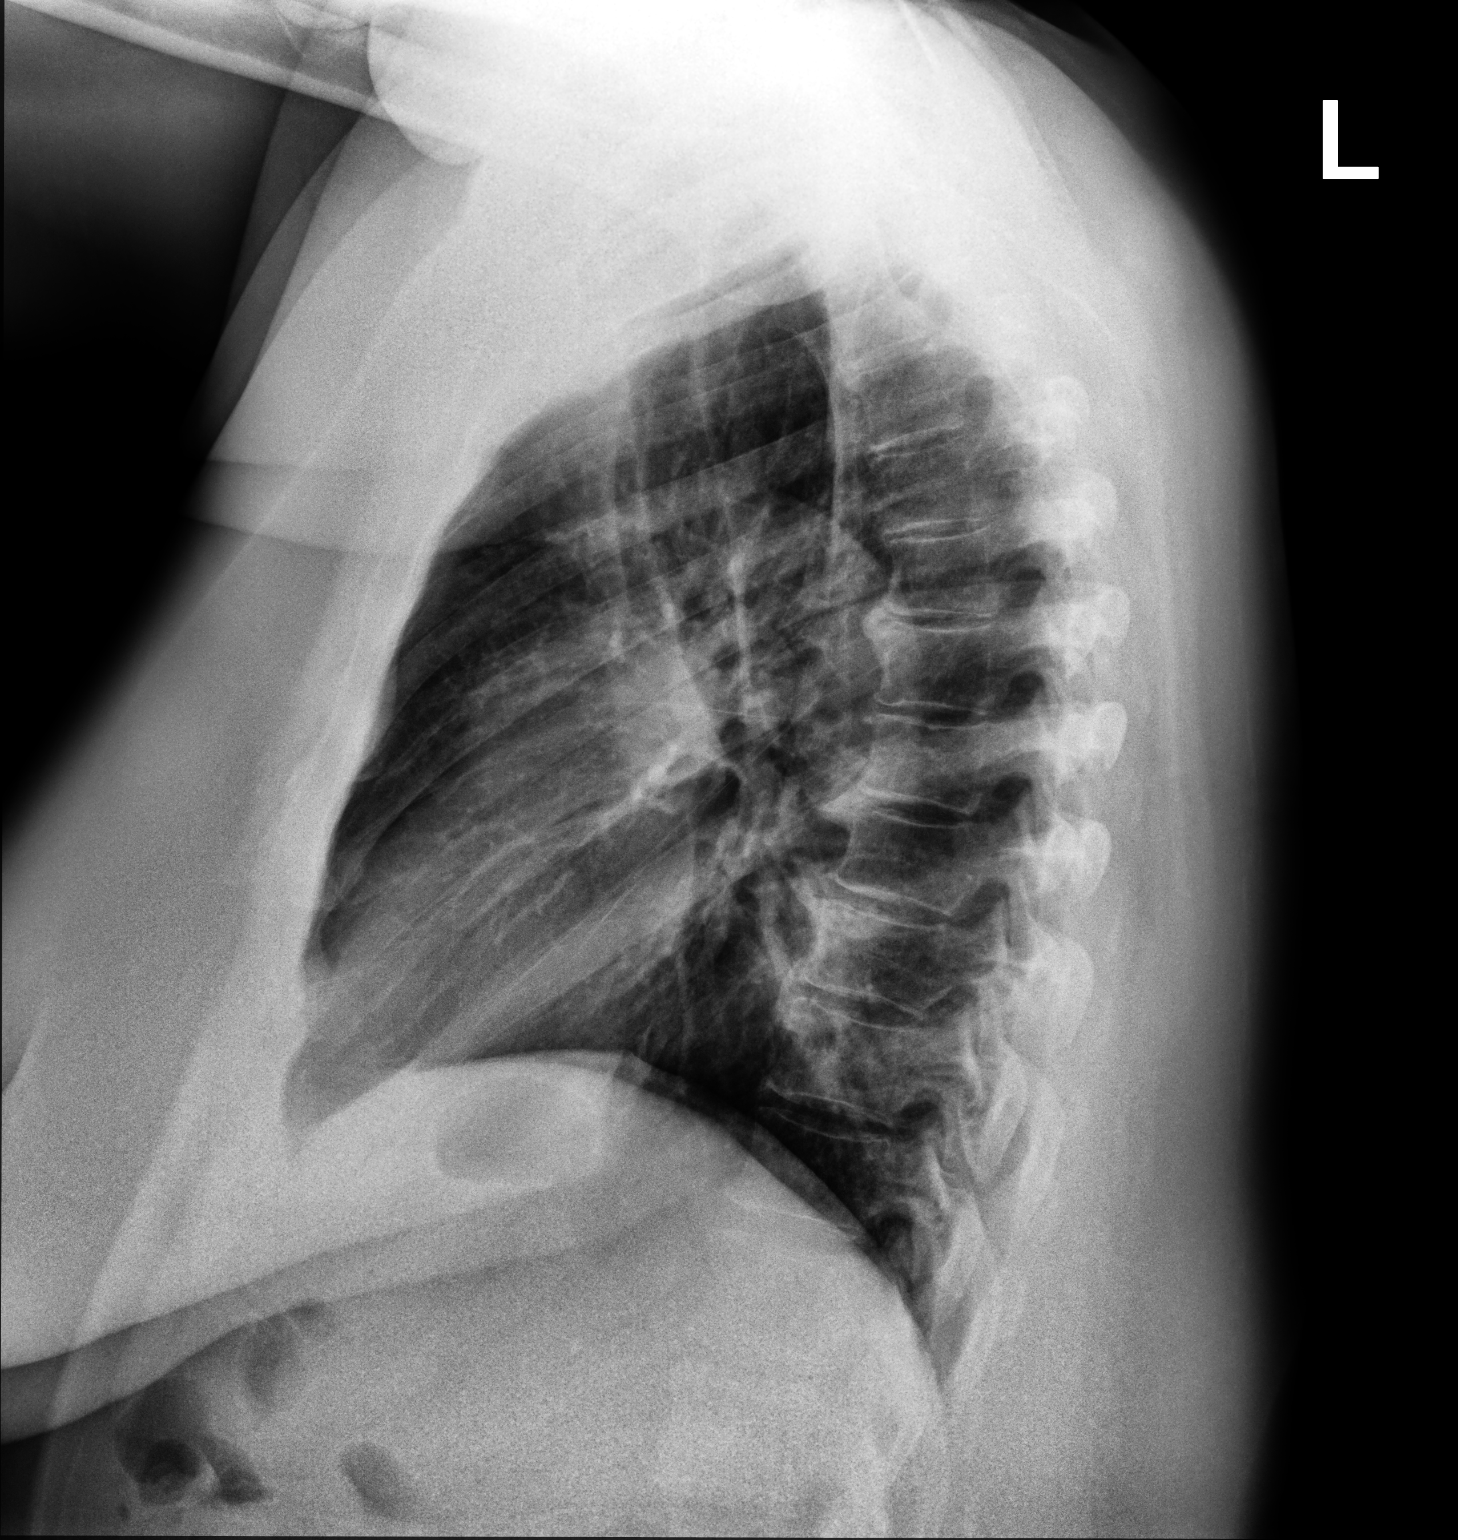

[2 of 2 positions shown; findings below may reference images not displayed]

FINDINGS: The heart size and mediastinal contours are within normal limits.
Both lungs are clear. The visualized skeletal structures are
unremarkable.
IMPRESSION: No active cardiopulmonary disease.

## 2021-01-25 LAB — HM MAMMOGRAPHY

## 2021-01-27 ENCOUNTER — Encounter: Payer: Self-pay | Admitting: *Deleted

## 2021-01-28 ENCOUNTER — Other Ambulatory Visit: Payer: Self-pay | Admitting: Nurse Practitioner

## 2021-02-07 ENCOUNTER — Ambulatory Visit: Payer: Commercial Managed Care - PPO | Admitting: Internal Medicine

## 2021-02-10 ENCOUNTER — Ambulatory Visit: Payer: Medicare Other | Admitting: Nurse Practitioner

## 2021-02-27 ENCOUNTER — Other Ambulatory Visit: Payer: Self-pay | Admitting: Nurse Practitioner

## 2021-02-27 DIAGNOSIS — I10 Essential (primary) hypertension: Secondary | ICD-10-CM

## 2021-02-27 DIAGNOSIS — I83893 Varicose veins of bilateral lower extremities with other complications: Secondary | ICD-10-CM

## 2021-03-10 ENCOUNTER — Encounter: Payer: Self-pay | Admitting: Internal Medicine

## 2021-03-10 ENCOUNTER — Ambulatory Visit: Payer: Medicare PPO | Admitting: Internal Medicine

## 2021-03-10 ENCOUNTER — Other Ambulatory Visit: Payer: Self-pay

## 2021-03-10 ENCOUNTER — Ambulatory Visit: Payer: Medicare PPO | Admitting: Nurse Practitioner

## 2021-03-10 ENCOUNTER — Encounter: Payer: Self-pay | Admitting: Nurse Practitioner

## 2021-03-10 DIAGNOSIS — R739 Hyperglycemia, unspecified: Secondary | ICD-10-CM

## 2021-03-10 DIAGNOSIS — I10 Essential (primary) hypertension: Secondary | ICD-10-CM | POA: Diagnosis not present

## 2021-03-10 DIAGNOSIS — E785 Hyperlipidemia, unspecified: Secondary | ICD-10-CM

## 2021-03-10 DIAGNOSIS — J302 Other seasonal allergic rhinitis: Secondary | ICD-10-CM

## 2021-03-10 DIAGNOSIS — M1A0410 Idiopathic chronic gout, right hand, without tophus (tophi): Secondary | ICD-10-CM

## 2021-03-10 DIAGNOSIS — J45991 Cough variant asthma: Secondary | ICD-10-CM

## 2021-03-10 DIAGNOSIS — E2839 Other primary ovarian failure: Secondary | ICD-10-CM

## 2021-03-10 NOTE — Assessment & Plan Note (Signed)
Body mass index is 46.63 kg/m.  -  trending up as sedentary due to covid Lab Results  Component Value Date   TSH 4.11 07/13/2016     Contributing to gerd risk/ doe/reviewed the need and the process to achieve and maintain neg calorie balance > defer f/u primary care including intermittently monitoring thyroid status           Each maintenance medication was reviewed in detail including emphasizing most importantly the difference between maintenance and prns and under what circumstances the prns are to be triggered using an action plan format where appropriate.  Total time for H and P, chart review, counseling, reviewing hfa device(s) and generating customized AVS unique to this office visit / same day charting = 22 min

## 2021-03-10 NOTE — Progress Notes (Addendum)
Traci Gomez, female    DOB: 1956-08-28,     MRN: [id number]   Brief patient profile:  87 yobf from [facility]  Never smoker worked in Sales promotion account executive in [employer] with onset sob dx as asthma in her 50s and rx singulair/prn saba  for decades  and better since 2005 when moved from  that office did better and rarely needed saba but worse in hot humid weather so increased to twice daily x saba since early August 2020 and  referred to pulmonary clinic 06/02/2019 by Traci Gomez        History of Present Illness  06/02/2019  Pulmonary/ 1st office eval/Traci Gomez on maint rx with singulair only  Chief Complaint  Patient presents with  . Pulmonary Consult    Referred by Traci Gomez. Pt states dxed with Asthma years ago. She feels that this is under control. She states been using her albuterol inhaler 2 x daily over the past 2 wks "since it's been humid".   Dyspnea:  MMRC2 = can't walk a nl pace on a flat grade s sob but does fine slow and flat  Cough: only right after saba > white mucus only   Worse on ventolin vs  proair but very poor technique on both - see a/p  Sleep: on side  Ok  / worse breathing supine x years = smothering/ sometimes choking supine SABA use: as above  Also using nebulizer once a day most days helps some rec Plan A = Automatic = symbicort 80 Take 2 puffs first thing in am and then another 2 puffs about 12 hours later  Work on inhaler technique Continue singulair daily  Pantoprazole (protonix) 40 mg   Take  30-60 min before first meal of the day and Pepcid (famotidine)  20 mg one after supper until return to office - this is the best way to tell whether stomach acid is contributing to your problem.   GERD diet   Plan B = Backup Only use your albuterol (PROAIR)  inhaler as a rescue medication  Plan C = Crisis - only use your albuterol nebulizer if you first try Plan B and it fails to help > ok to use the nebulizer up to every 4 hours but if start needing it regularly call for  immediate appointment Prednisone 10 mg take  4 each am x 2 days,   2 each am x 2 days,  1 each am x 2 days and stop  Please schedule a follow up office visit in 2 weeks, sooner if needed  with all medications /inhalers/ solutions in hand so we can verify exactly what you are taking. This includes all medications from all doctors and over the counters    06/22/2019  f/u ov/Traci Gomez re: likely asthma/ better on singulair/symb 80 2bid Chief Complaint  Patient presents with  . Follow-up    Breathing is doing better and she has not had to use her albuterol inhaler or neb.   Dyspnea:  Not limited by breathing from desired activities   Cough: none Sleeping: now able to flat  SABA use: none 02: none  rec To get the most out of exercise, you need to be continuously aware that you are short of breath     02/12/2020  f/u ov/Traci Gomez re: probable asthma/ maint on symb 80 2bid/singulair   Chief Complaint  Patient presents with  . Follow-up    Breathing is much improved. She has not had to use her albuterol inhaler or  neb.   Dyspnea:  Not limited /  walking 30 min daily no aerobics though  Cough: none Sleeping: able to lie flat fine / one pillow SABA use: none 02: none  rec Symbicort 80  1-2 puffs every 12 hours  Only use your albuterol as a rescue medication     03/10/2021  f/u ov/Traci Gomez re: mild astha/ vcs on symb 80 2 in am / one in pm/singulair / gerd rx Chief Complaint  Patient presents with  . Follow-up    Doing well! No complaints or concerns at this time   Dyspnea:  No walking  Cough: none Sleeping: fine flat/ one pillow SABA use: none  02: none  Covid status:   vax x 4    No obvious day to day or daytime variability or assoc excess/ purulent sputum or mucus plugs or hemoptysis or cp or chest tightness, subjective wheeze or overt sinus or hb symptoms.   Sleeping without nocturnal  or early am exacerbation  of respiratory  c/o's or need for noct saba. Also denies any obvious  fluctuation of symptoms with weather or environmental changes or other aggravating or alleviating factors except as outlined above   No unusual exposure hx or h/o childhood pna/ asthma or knowledge of premature birth.  Current Allergies, Complete Past Medical History, Past Surgical History, Family History, and Social History were reviewed in [employer] record.  ROS  The following are not active complaints unless bolded Hoarseness, sore throat, dysphagia, dental problems, itching, sneezing,  nasal congestion or discharge of excess mucus or purulent secretions, ear ache,   fever, chills, sweats, unintended wt loss or wt gain, classically pleuritic or exertional cp,  orthopnea pnd or arm/hand swelling  or leg swelling, presyncope, palpitations, abdominal pain, anorexia, nausea, vomiting, diarrhea  or change in bowel habits or change in bladder habits, change in stools or change in urine, dysuria, hematuria,  rash, arthralgias, visual complaints, headache, numbness, weakness or ataxia or problems with walking or coordination,  change in mood or  memory.        Current Meds  Medication Sig  . acetaminophen (TYLENOL) 500 MG tablet Take 500 mg by mouth as needed.  Marland Kitchen albuterol (VENTOLIN HFA) 108 (90 Base) MCG/ACT inhaler Inhale 2 puffs into the lungs every 6 (six) hours as needed for wheezing or shortness of breath.  . Albuterol Sulfate 2.5 MG/0.5ML NEBU Inhale 0.5 mLs (2.5 mg total) into the lungs as needed.  Marland Kitchen allopurinol (ZYLOPRIM) 100 MG tablet TAKE 1 TABLET(100 MG) BY MOUTH DAILY  . amLODipine (NORVASC) 5 MG tablet TAKE 1 TABLET(5 MG) BY MOUTH DAILY  . azelastine (OPTIVAR) 0.05 % ophthalmic solution Place 1 drop into both eyes 2 (two) times daily.  . budesonide-formoterol (SYMBICORT) 80-4.5 MCG/ACT inhaler INHALE 2 PUFFS INTO THE LUNGS TWICE DAILY  . famotidine (PEPCID) 20 MG tablet One after supper  . furosemide (LASIX) 40 MG tablet TAKE 1 AND 1/2 TABLETS BY MOUTH DAILY  .  metoprolol succinate (TOPROL-XL) 25 MG 24 hr tablet TAKE 1 TABLET(25 MG) BY MOUTH DAILY  . montelukast (SINGULAIR) 10 MG tablet TAKE 1 TABLET(10 MG) BY MOUTH AT BEDTIME  . nystatin (MYCOSTATIN/NYSTOP) powder Apply topically as needed.  . nystatin cream (MYCOSTATIN) Apply 1 application topically 2 (two) times daily as needed. For yeast  . olmesartan (BENICAR) 20 MG tablet TAKE 1 TABLET(20 MG) BY MOUTH DAILY  . pantoprazole (PROTONIX) 40 MG tablet TAKE 1 TABLET(40 MG) BY MOUTH DAILY 30 TO 60 MINUTES  BEFORE FIRST MEAL OF THE DAY  . potassium chloride (MICRO-K) 10 MEQ CR capsule TAKE 1 CAPSULE(10 MEQ) BY MOUTH DAILY  . Vitamin D, Cholecalciferol, 25 MCG (1000 UT) CAPS Take 1,000 Units by mouth daily.                             Past Medical History:  Diagnosis Date  . Allergic rhinitis, unspecified   . Arthritis    Per [facility] new patient packet   . Asthma    Per [facility] new patient packet   . Gout, unspecified   . Hyperglycemia    Per labs completed on 03/15/2019 @ [facility]  . Hyperlipidemia   . Hypertension   . Irregular bowel habits    Per [facility] new patient packet   . Right knee injury    Stretched veins in right knee   . Upper respiratory infection   . Varicose vein of leg    Right, Per [facility] new patient packet   . Vitamin D deficiency          Objective:      03/10/2021        325  02/12/2020       320  06/22/19 (!) 321 lb (145.6 kg)  06/02/19 (!) 322 lb (146.1 kg)  06/02/19 (!) 313 lb (142 kg)    Vital signs reviewed  03/10/2021  - Note at rest 02 sats  100% on RA   General appearance:    Obese jolly bf nad    HEENT : pt wearing mask not removed for exam due to covid -19 concerns.    NECK :  without JVD/Nodes/TM/ nl carotid upstrokes bilaterally   LUNGS: no acc muscle use,  Nl contour chest which is clear to A and P bilaterally without cough on insp or exp maneuvers   CV:  RRR  no s3 or murmur or increase in P2, and no edema   ABD:  soft and nontender with nl  inspiratory excursion in the supine position. No bruits or organomegaly appreciated, bowel sounds nl  MS:  Nl gait/ ext warm without deformities, calf tenderness, cyanosis or clubbing No obvious joint restrictions   SKIN: warm and dry without lesions    NEURO:  alert, approp, nl sensorium with  no motor or cerebellar deficits apparent.              Assessment

## 2021-03-10 NOTE — Patient Instructions (Signed)
Work on inhaler technique:  relax and gently blow all the way out then take a nice smooth deep breath back in, triggering the inhaler at same time you start breathing in.  Hold for up to 5 seconds if you can. Blow out thru nose. Rinse and gargle with water when done     if allergies continue to concern you,  I would recommend you see Dr Lucie Leather    If you are satisfied with your treatment plan,  let your doctor know and he/she can either refill your medications or you can return here when your prescription runs out.     If in any way you are not 100% satisfied,  please tell us.  If 100% better, tell your friends!  Pulmonary follow up is as needed

## 2021-03-10 NOTE — Progress Notes (Signed)
Careteam: Patient Care Team: Lauree Chandler, NP as PCP - General (Geriatric Medicine)  PLACE OF SERVICE:  [facility] Directive information Does Patient Have a Medical Advance Directive?: Yes;No, Does patient want to make changes to medical advance directive?: No - Patient declined  Allergies  Allergen Reactions  . Bactrim [Sulfamethoxazole-Trimethoprim]   . Keflex [Cephalexin]   . Omnicef [Cefdinir]   . Sulfa Antibiotics     Chief Complaint  Patient presents with  . Medical Management of Chronic Issues    6 month follow up. Discuss need for Dexa scan.     HPI: Patient is a 65 y.o. female for routine follow up.   Asthma- well controlled.  Sleeping well. Told she was released from pulmonary   Having increase anxiety over COVID- she would not go anywhere. Now things are doing better.  Getting out more.   Gout- no recent flares   Doing well. No concerns today Review of Systems:  Review of Systems  Constitutional: Negative for chills, fever and weight loss.  HENT: Negative for tinnitus.   Respiratory: Negative for cough, sputum production and shortness of breath.   Cardiovascular: Negative for chest pain, palpitations and leg swelling.  Gastrointestinal: Negative for abdominal pain, constipation, diarrhea and heartburn.  Genitourinary: Negative for dysuria, frequency and urgency.  Musculoskeletal: Negative for back pain, falls, joint pain and myalgias.  Skin: Negative.   Neurological: Negative for dizziness and headaches.  Psychiatric/Behavioral: Negative for depression and memory loss. The patient is nervous/anxious. The patient does not have insomnia.     Past Medical History:  Diagnosis Date  . Allergic rhinitis, unspecified   . Arthritis    Per [place] new patient packet   . Asthma    Per [place] new patient packet   . Gout, unspecified   . Hyperglycemia    Per labs completed on 03/15/2019 @ [facility]  . Hyperlipidemia   . Hypertension   . Irregular  bowel habits    Per [facility] new patient packet   . Right knee injury    Stretched veins in right knee   . Upper respiratory infection   . Varicose vein of leg    Right, Per [facility] new patient packet   . Vitamin D deficiency    Past Surgical History:  Procedure Laterality Date  . [facility]    Social History:   reports that she has never smoked. She has never used smokeless tobacco. She reports previous alcohol use. She reports previous drug use.  Family History  Problem Relation Age of Onset  . High blood pressure Mother   . Hypertension Mother   . Heart disease Father   . COPD Father   . Heart disease Brother     Medications: Patient's Medications  New Prescriptions   No medications on file  Previous Medications   ACETAMINOPHEN (TYLENOL) 500 MG TABLET    Take 500 mg by mouth as needed.   ALBUTEROL (VENTOLIN HFA) 108 (90 BASE) MCG/ACT INHALER    Inhale 2 puffs into the lungs every 6 (six) hours as needed for wheezing or shortness of breath.   ALBUTEROL SULFATE 2.5 MG/0.5ML NEBU    Inhale 0.5 mLs (2.5 mg total) into the lungs as needed.   ALLOPURINOL (ZYLOPRIM) 100 MG TABLET    TAKE 1 TABLET(100 MG) BY MOUTH DAILY   AMLODIPINE (NORVASC) 5 MG TABLET    TAKE 1 TABLET(5 MG) BY MOUTH DAILY  AZELASTINE (OPTIVAR) 0.05 % OPHTHALMIC SOLUTION    Place 1 drop into both eyes 2 (two) times daily.   BUDESONIDE-FORMOTEROL (SYMBICORT) 80-4.5 MCG/ACT INHALER    INHALE 2 PUFFS INTO THE LUNGS TWICE DAILY   FAMOTIDINE (PEPCID) 20 MG TABLET    One after supper   FUROSEMIDE (LASIX) 40 MG TABLET    TAKE 1 AND 1/2 TABLETS BY MOUTH DAILY   METOPROLOL SUCCINATE (TOPROL-XL) 25 MG 24 HR TABLET    TAKE 1 TABLET(25 MG) BY MOUTH DAILY   MONTELUKAST (SINGULAIR) 10 MG TABLET    TAKE 1 TABLET(10 MG) BY MOUTH AT BEDTIME   NYSTATIN (MYCOSTATIN/NYSTOP) POWDER    Apply topically as needed.   NYSTATIN CREAM (MYCOSTATIN)    Apply 1 application topically 2 (two) times daily as  needed. For yeast   OLMESARTAN (BENICAR) 20 MG TABLET    TAKE 1 TABLET(20 MG) BY MOUTH DAILY   PANTOPRAZOLE (PROTONIX) 40 MG TABLET    TAKE 1 TABLET(40 MG) BY MOUTH DAILY 30 TO 60 MINUTES BEFORE FIRST MEAL OF THE DAY   POTASSIUM CHLORIDE (MICRO-K) 10 MEQ CR CAPSULE    TAKE 1 CAPSULE(10 MEQ) BY MOUTH DAILY   VITAMIN D, CHOLECALCIFEROL, 25 MCG (1000 UT) CAPS    Take 1,000 Units by mouth daily.  Modified Medications   No medications on file  Discontinued Medications   No medications on file    Physical Exam:  Vitals:   03/10/21 0950  BP: 130/70  Pulse: 76  Temp: (!) 97.1 F (36.2 C)  SpO2: 98%  Weight: (!) 325 lb (147.4 kg)  Height: _0  (1.778 m)   Body mass index is 46.63 kg/m. Wt Readings from Last 3 Encounters:  03/10/21 (!) 325 lb (147.4 kg)  03/10/21 (!) 325 lb (147.4 kg)  08/12/20 (!) 315 lb (142.9 kg)    Physical Exam Constitutional:      General: She is not in acute distress.    Appearance: She is well-developed. She is not diaphoretic.  HENT:     Head: Normocephalic and atraumatic.     Mouth/Throat:     Pharynx: No oropharyngeal exudate.  Eyes:     Conjunctiva/sclera: Conjunctivae normal.     Pupils: Pupils are equal, round, and reactive to light.  Cardiovascular:     Rate and Rhythm: Normal rate and regular rhythm.     Heart sounds: Normal heart sounds.  Pulmonary:     Effort: Pulmonary effort is normal.     Breath sounds: Normal breath sounds.  Abdominal:     General: Bowel sounds are normal.     Palpations: Abdomen is soft.  Musculoskeletal:        General: No tenderness.     Cervical back: Normal range of motion and neck supple.  Skin:    General: Skin is warm and dry.  Neurological:     Mental Status: She is alert and oriented to person, place, and time.     Labs reviewed: Basic Metabolic Panel: Recent Labs    08/12/20 1017  NA 139  K 4.6  CL 102  CO2 30  GLUCOSE 107*  BUN 16  CREATININE 1.12*  CALCIUM 9.5   Liver Function  Tests: Recent Labs    08/12/20 1017  AST 21  ALT 16  BILITOT 1.1  PROT 7.1   No results for input(s): LIPASE, AMYLASE in the last 8760 hours. No results for input(s): AMMONIA in the last 8760 hours. CBC: Recent Labs    08/12/20 1017  WBC 3.4*  NEUTROABS 1,292*  HGB 14.9  HCT 45.3*  MCV 86.3  PLT 269   Lipid Panel: No results for input(s): CHOL, HDL, LDLCALC, TRIG, CHOLHDL, LDLDIRECT in the last 8760 hours. TSH: No results for input(s): TSH in the last 8760 hours. A1C: Lab Results  Component Value Date   HGBA1C 6.0 (H) 08/12/2020     Assessment/Plan 1. 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   2. Essential hypertension -well controlled on current regimen, continue dietary modifications.  - CMP with eGFR([facility]) - CBC with Differential/Platelet  3. Chronic gout of right hand, unspecified cause No recent flares, continue dietary modifications with allopurinol.   4. Hyperlipidemia, unspecified hyperlipidemia type -continue dietary modifications. - Lipid Panel - CMP with eGFR([facility])  5. Hyperglycemia -continue dietary modifications.  - Hemoglobin A1c  6. Cough variant asthma -stable at this time. Will continue current regimen.   7. Estrogen deficiency - DG Bone Density; Future  8. Seasonal allergies Controlled on zyrtec   Next appt: 6 months.  Carlos American. [place], [facility] [phone]

## 2021-03-10 NOTE — Patient Instructions (Addendum)
Increase physical activity. Wake up a little early and take 30 minute walk daily - this will help you lose weight and help your mental health  Can take Flonase in the evening daily to help with stuffiness    Calorie Counting for Weight Loss Calories are units of energy. Your body needs a certain number of calories from food to keep going throughout the day. When you eat or drink more calories than your body needs, your body stores the extra calories mostly as fat. When you eat or drink fewer calories than your body needs, your body burns fat to get the energy it needs. Calorie counting means keeping track of how many calories you eat and drink each day. Calorie counting can be helpful if you need to lose weight. If you eat fewer calories than your body needs, you should lose weight. Ask your health care provider what a healthy weight is for you. For calorie counting to work, you will need to eat the right number of calories each day to lose a healthy amount of weight per week. A dietitian can help you figure out how many calories you need in a day and will suggest ways to reach your calorie goal.  A healthy amount of weight to lose each week is usually 1-2 lb (0.5-0.9 kg). This usually means that your daily calorie intake should be reduced by 500-750 calories.  Eating 1,200-1,500 calories a day can help most women lose weight.  Eating 1,500-1,800 calories a day can help most men lose weight. What do I need to know about calorie counting? Work with your health care provider or dietitian to determine how many calories you should get each day. To meet your daily calorie goal, you will need to:  Find out how many calories are in each food that you would like to eat. Try to do this before you eat.  Decide how much of the food you plan to eat.  Keep a food log. Do this by writing down what you ate and how many calories it had. To successfully lose weight, it is important to balance calorie counting  with a healthy lifestyle that includes regular activity. Where do I find calorie information? The number of calories in a food can be found on a Nutrition Facts label. If a food does not have a Nutrition Facts label, try to look up the calories online or ask your dietitian for help. Remember that calories are listed per serving. If you choose to have more than one serving of a food, you will have to multiply the calories per serving by the number of servings you plan to eat. For example, the label on a package of bread might say that a serving size is 1 slice and that there are 90 calories in a serving. If you eat 1 slice, you will have eaten 90 calories. If you eat 2 slices, you will have eaten 180 calories.   How do I keep a food log? After each time that you eat, record the following in your food log as soon as possible:  What you ate. Be sure to include toppings, sauces, and other extras on the food.  How much you ate. This can be measured in cups, ounces, or number of items.  How many calories were in each food and drink.  The total number of calories in the food you ate. Keep your food log near you, such as in a pocket-sized notebook or on an app or website  on your mobile phone. Some programs will calculate calories for you and show you how many calories you have left to meet your daily goal. What are some portion-control tips?  Know how many calories are in a serving. This will help you know how many servings you can have of a certain food.  Use a measuring cup to measure serving sizes. You could also try weighing out portions on a kitchen scale. With time, you will be able to estimate serving sizes for some foods.  Take time to put servings of different foods on your favorite plates or in your favorite bowls and cups so you know what a serving looks like.  Try not to eat straight from a food's packaging, such as from a bag or box. Eating straight from the package makes it hard to see  how much you are eating and can lead to overeating. Put the amount you would like to eat in a cup or on a plate to make sure you are eating the right portion.  Use smaller plates, glasses, and bowls for smaller portions and to prevent overeating.  Try not to multitask. For example, avoid watching TV or using your computer while eating. If it is time to eat, sit down at a table and enjoy your food. This will help you recognize when you are full. It will also help you be more mindful of what and how much you are eating. What are tips for following this plan? Reading food labels  Check the calorie count compared with the serving size. The serving size may be smaller than what you are used to eating.  Check the source of the calories. Try to choose foods that are high in protein, fiber, and vitamins, and low in saturated fat, trans fat, and sodium. Shopping  Read nutrition labels while you shop. This will help you make healthy decisions about which foods to buy.  Pay attention to nutrition labels for low-fat or fat-free foods. These foods sometimes have the same number of calories or more calories than the full-fat versions. They also often have added sugar, starch, or salt to make up for flavor that was removed with the fat.  Make a grocery list of lower-calorie foods and stick to it. Cooking  Try to cook your favorite foods in a healthier way. For example, try baking instead of frying.  Use low-fat dairy products. Meal planning  Use more fruits and vegetables. One-half of your plate should be fruits and vegetables.  Include lean proteins, such as chicken, [place], and fish. Lifestyle Each week, aim to do one of the following:  150 minutes of moderate exercise, such as walking.  75 minutes of vigorous exercise, such as running. General information  Know how many calories are in the foods you eat most often. This will help you calculate calorie counts faster.  Find a way of tracking  calories that works for you. Get creative. Try different apps or programs if writing down calories does not work for you. What foods should I eat?  Eat nutritious foods. It is better to have a nutritious, high-calorie food, such as an avocado, than a food with few nutrients, such as a bag of potato chips.  Use your calories on foods and drinks that will fill you up and will not leave you hungry soon after eating. ? Examples of foods that fill you up are nuts and nut butters, vegetables, lean proteins, and high-fiber foods such as whole grains. High-fiber foods are  foods with more than 5 g of fiber per serving.  Pay attention to calories in drinks. Low-calorie drinks include water and unsweetened drinks. The items listed above may not be a complete list of foods and beverages you can eat. Contact a dietitian for more information.   What foods should I limit? Limit foods or drinks that are not good sources of vitamins, minerals, or protein or that are high in unhealthy fats. These include:  Candy.  Other sweets.  Sodas, specialty coffee drinks, alcohol, and juice. The items listed above may not be a complete list of foods and beverages you should avoid. Contact a dietitian for more information. How do I count calories when eating out?  Pay attention to portions. Often, portions are much larger when eating out. Try these tips to keep portions smaller: ? Consider sharing a meal instead of getting your own. ? If you get your own meal, eat only half of it. Before you start eating, ask for a container and put half of your meal into it. ? When available, consider ordering smaller portions from the menu instead of full portions.  Pay attention to your food and drink choices. Knowing the way food is cooked and what is included with the meal can help you eat fewer calories. ? If calories are listed on the menu, choose the lower-calorie options. ? Choose dishes that include vegetables, fruits, whole  grains, low-fat dairy products, and lean proteins. ? Choose items that are boiled, broiled, grilled, or steamed. Avoid items that are buttered, battered, fried, or served with cream sauce. Items labeled as crispy are usually fried, unless stated otherwise. ? Choose water, low-fat milk, unsweetened iced tea, or other drinks without added sugar. If you want an alcoholic beverage, choose a lower-calorie option, such as a glass of wine or light beer. ? Ask for dressings, sauces, and syrups on the side. These are usually high in calories, so you should limit the amount you eat. ? If you want a salad, choose a garden salad and ask for grilled meats. Avoid extra toppings such as bacon, cheese, or fried items. Ask for the dressing on the side, or ask for olive oil and vinegar or lemon to use as dressing.  Estimate how many servings of a food you are given. Knowing serving sizes will help you be aware of how much food you are eating at restaurants. Where to find more information  Centers for Disease Control and Prevention: FootballExhibition.com.br  [place] Department of Agriculture: WrestlingReporter.dk Summary  Calorie counting means keeping track of how many calories you eat and drink each day. If you eat fewer calories than your body needs, you should lose weight.  A healthy amount of weight to lose per week is usually 1-2 lb (0.5-0.9 kg). This usually means reducing your daily calorie intake by 500-750 calories.  The number of calories in a food can be found on a Nutrition Facts label. If a food does not have a Nutrition Facts label, try to look up the calories online or ask your dietitian for help.  Use smaller plates, glasses, and bowls for smaller portions and to prevent overeating.  Use your calories on foods and drinks that will fill you up and not leave you hungry shortly after a meal. This information is not intended to replace advice given to you by your health care provider. Make sure you discuss any questions you  have with your health care provider. Document Revised: 11/16/2019 Document Reviewed: 11/16/2019 Elsevier Patient  Education  2021 Elsevier Inc.  

## 2021-03-10 NOTE — Assessment & Plan Note (Signed)
Onset in her 42s maint on singulair  - 06/02/2019   try symbicort 80 2bid and max gerd rx  - Allergy profile  06/02/19 >  Eos 81/  IgE  97  RAST neg  - 03/10/2021  After extensive coaching inhaler device,  effectiveness =    75%   All goals of chronic asthma control met including optimal function and elimination of symptoms with minimal need for rescue therapy.  Contingencies discussed in full including contacting this office immediately if not controlling the symptoms using the rule of two's.     F/u can be q year or let PCP refill meds

## 2021-03-11 LAB — CBC WITH DIFFERENTIAL/PLATELET
Absolute Monocytes: 437 cells/uL (ref 200–950)
Basophils Absolute: 30 cells/uL (ref 0–200)
Basophils Relative: 0.8 %
Eosinophils Absolute: 19 cells/uL (ref 15–500)
Eosinophils Relative: 0.5 %
HCT: 43.4 % (ref 35.0–45.0)
Hemoglobin: 14 g/dL (ref 11.7–15.5)
Lymphs Abs: 1573 cells/uL (ref 850–3900)
MCH: 27.8 pg (ref 27.0–33.0)
MCHC: 32.3 g/dL (ref 32.0–36.0)
MCV: 86.1 fL (ref 80.0–100.0)
MPV: 10.7 fL (ref 7.5–12.5)
Monocytes Relative: 11.8 %
Neutro Abs: 1643 cells/uL (ref 1500–7800)
Neutrophils Relative %: 44.4 %
Platelets: 260 10*3/uL (ref 140–400)
RBC: 5.04 10*6/uL (ref 3.80–5.10)
RDW: 13 % (ref 11.0–15.0)
Total Lymphocyte: 42.5 %
WBC: 3.7 10*3/uL — ABNORMAL LOW (ref 3.8–10.8)

## 2021-03-11 LAB — HEMOGLOBIN A1C
Hgb A1c MFr Bld: 6 % of total Hgb — ABNORMAL HIGH (ref <5.7)
Mean Plasma Glucose: 126 mg/dL
eAG (mmol/L): 7 mmol/L

## 2021-03-11 LAB — COMPLETE METABOLIC PANEL WITH GFR
AG Ratio: 1.3 (calc) (ref 1.0–2.5)
ALT: 16 U/L (ref 6–29)
AST: 21 U/L (ref 10–35)
Albumin: 3.9 g/dL (ref 3.6–5.1)
Alkaline phosphatase (APISO): 77 U/L (ref 37–153)
BUN/Creatinine Ratio: 18 (calc) (ref 6–22)
BUN: 20 mg/dL (ref 7–25)
CO2: 28 mmol/L (ref 20–32)
Calcium: 9.2 mg/dL (ref 8.6–10.4)
Chloride: 104 mmol/L (ref 98–110)
Creat: 1.12 mg/dL — ABNORMAL HIGH (ref 0.50–0.99)
GFR, Est African American: 60 mL/min/{1.73_m2} (ref >=60)
GFR, Est Non African American: 52 mL/min/{1.73_m2} — ABNORMAL LOW (ref >=60)
Globulin: 3 g/dL (calc) (ref 1.9–3.7)
Glucose, Bld: 103 mg/dL — ABNORMAL HIGH (ref 65–99)
Potassium: 4.1 mmol/L (ref 3.5–5.3)
Sodium: 142 mmol/L (ref 135–146)
Total Bilirubin: 0.6 mg/dL (ref 0.2–1.2)
Total Protein: 6.9 g/dL (ref 6.1–8.1)

## 2021-03-11 LAB — LIPID PANEL
Cholesterol: 159 mg/dL (ref <200)
HDL: 58 mg/dL (ref >=50)
LDL Cholesterol (Calc): 82 mg/dL (calc)
Non-HDL Cholesterol (Calc): 101 mg/dL (calc) (ref <130)
Total CHOL/HDL Ratio: 2.7 (calc) (ref <5.0)
Triglycerides: 91 mg/dL (ref <150)

## 2021-03-25 ENCOUNTER — Other Ambulatory Visit: Payer: Medicare PPO

## 2021-03-29 ENCOUNTER — Other Ambulatory Visit: Payer: Self-pay | Admitting: Nurse Practitioner

## 2021-03-29 DIAGNOSIS — I83893 Varicose veins of bilateral lower extremities with other complications: Secondary | ICD-10-CM

## 2021-03-29 DIAGNOSIS — I10 Essential (primary) hypertension: Secondary | ICD-10-CM

## 2021-04-08 ENCOUNTER — Other Ambulatory Visit: Payer: Self-pay | Admitting: Nurse Practitioner

## 2021-04-08 DIAGNOSIS — B372 Candidiasis of skin and nail: Secondary | ICD-10-CM

## 2021-04-17 ENCOUNTER — Ambulatory Visit
Admission: RE | Admit: 2021-04-17 | Discharge: 2021-04-17 | Disposition: A | Discharge status: Home / Self Care | Payer: Medicare PPO | Source: Ambulatory Visit | Attending: Nurse Practitioner | Admitting: Nurse Practitioner

## 2021-04-17 ENCOUNTER — Other Ambulatory Visit: Payer: Self-pay

## 2021-04-17 DIAGNOSIS — E2839 Other primary ovarian failure: Secondary | ICD-10-CM

## 2021-04-22 ENCOUNTER — Other Ambulatory Visit: Payer: Self-pay | Admitting: Nurse Practitioner

## 2021-04-22 DIAGNOSIS — M1A0410 Idiopathic chronic gout, right hand, without tophus (tophi): Secondary | ICD-10-CM

## 2021-04-28 ENCOUNTER — Other Ambulatory Visit: Payer: Self-pay | Admitting: Nurse Practitioner

## 2021-05-29 ENCOUNTER — Other Ambulatory Visit: Payer: Self-pay | Admitting: Nurse Practitioner

## 2021-05-29 DIAGNOSIS — I10 Essential (primary) hypertension: Secondary | ICD-10-CM

## 2021-05-29 DIAGNOSIS — I83893 Varicose veins of bilateral lower extremities with other complications: Secondary | ICD-10-CM

## 2021-06-27 ENCOUNTER — Other Ambulatory Visit: Payer: Self-pay | Admitting: Internal Medicine

## 2021-06-27 DIAGNOSIS — J45991 Cough variant asthma: Secondary | ICD-10-CM

## 2021-07-02 ENCOUNTER — Other Ambulatory Visit: Payer: Self-pay | Admitting: Nurse Practitioner

## 2021-07-02 DIAGNOSIS — J45991 Cough variant asthma: Secondary | ICD-10-CM

## 2021-07-07 ENCOUNTER — Encounter: Payer: Self-pay | Admitting: Nurse Practitioner

## 2021-07-07 DIAGNOSIS — J45991 Cough variant asthma: Secondary | ICD-10-CM

## 2021-07-08 MED ORDER — PANTOPRAZOLE SODIUM 40 MG PO TBEC: DELAYED_RELEASE_TABLET | ORAL | 3 refills | Status: DC

## 2021-07-08 NOTE — Telephone Encounter (Signed)
Pended Rx and sent to Jessica for approval.  

## 2021-07-10 ENCOUNTER — Other Ambulatory Visit: Payer: Self-pay

## 2021-07-10 ENCOUNTER — Ambulatory Visit: Payer: Medicare PPO | Admitting: Nurse Practitioner

## 2021-07-17 ENCOUNTER — Other Ambulatory Visit: Payer: Self-pay | Admitting: Internal Medicine

## 2021-07-17 DIAGNOSIS — J45991 Cough variant asthma: Secondary | ICD-10-CM

## 2021-07-22 ENCOUNTER — Other Ambulatory Visit: Payer: Self-pay | Admitting: Nurse Practitioner

## 2021-07-29 ENCOUNTER — Encounter: Payer: Self-pay | Admitting: Nurse Practitioner

## 2021-07-29 ENCOUNTER — Other Ambulatory Visit: Payer: Self-pay

## 2021-07-29 ENCOUNTER — Telehealth: Payer: Self-pay

## 2021-07-29 ENCOUNTER — Ambulatory Visit (INDEPENDENT_AMBULATORY_CARE_PROVIDER_SITE_OTHER): Payer: Medicare PPO | Admitting: Nurse Practitioner

## 2021-07-29 DIAGNOSIS — Z0000 Encounter for general adult medical examination without abnormal findings: Secondary | ICD-10-CM | POA: Diagnosis not present

## 2021-07-29 NOTE — Patient Instructions (Signed)
[name] , Thank you for taking time to come for your Medicare Wellness Visit. I appreciate your ongoing commitment to your health goals. Please review the following plan we discussed and let me know if I can assist you in the future.   Screening recommendations/referrals: Colonoscopy up to date Mammogram up to date Bone Density up to date Recommended yearly ophthalmology/optometry visit for glaucoma screening and checkup Recommended yearly dental visit for hygiene and checkup  Vaccinations: Influenza vaccine DUE Pneumococcal vaccine DUE after 08/12/21 Tdap vaccine up to date  Shingles vaccine up to date    Advanced directives: recommended to complete and place on file.   Conditions/risks identified: obesity, advance age.   Next appointment: yearly for AWV   Preventive Care 65 Years and Older, Female Preventive care refers to lifestyle choices and visits with your health care provider that can promote health and wellness. What does preventive care include? A yearly physical exam. This is also called an annual well check. Dental exams once or twice a year. Routine eye exams. Ask your health care provider how often you should have your eyes checked. Personal lifestyle choices, including: Daily care of your teeth and gums. Regular physical activity. Eating a healthy diet. Avoiding tobacco and drug use. Limiting alcohol use. Practicing safe sex. Taking low-dose aspirin every day. Taking vitamin and mineral supplements as recommended by your health care provider. What happens during an annual well check? The services and screenings done by your health care provider during your annual well check will depend on your age, overall health, lifestyle risk factors, and family history of disease. Counseling  Your health care provider may ask you questions about your: Alcohol use. Tobacco use. Drug use. Emotional well-being. Home and relationship well-being. Sexual  activity. Eating habits. History of falls. Memory and ability to understand (cognition). Work and work Astronomer. Reproductive health. Screening  You may have the following tests or measurements: Height, weight, and BMI. Blood pressure. Lipid and cholesterol levels. These may be checked every 5 years, or more frequently if you are over 48 years old. Skin check. Lung cancer screening. You may have this screening every year starting at age 66 if you have a 30-pack-year history of smoking and currently smoke or have quit within the past 15 years. Fecal occult blood test (FOBT) of the stool. You may have this test every year starting at age 66. Flexible sigmoidoscopy or colonoscopy. You may have a sigmoidoscopy every 5 years or a colonoscopy every 10 years starting at age 66. Hepatitis C blood test. Hepatitis B blood test. Sexually transmitted disease (STD) testing. Diabetes screening. This is done by checking your blood sugar (glucose) after you have not eaten for a while (fasting). You may have this done every 1-3 years. Bone density scan. This is done to screen for osteoporosis. You may have this done starting at age 66. Mammogram. This may be done every 1-2 years. Talk to your health care provider about how often you should have regular mammograms. Talk with your health care provider about your test results, treatment options, and if necessary, the need for more tests. Vaccines  Your health care provider may recommend certain vaccines, such as: Influenza vaccine. This is recommended every year. Tetanus, diphtheria, and acellular pertussis (Tdap, Td) vaccine. You may need a Td booster every 10 years. Zoster vaccine. You may need this after age 66. Pneumococcal 13-valent conjugate (PCV13) vaccine. One dose is recommended after  age 66. Pneumococcal polysaccharide (PPSV23) vaccine. One dose is recommended after  age 66. Talk to your health care provider about which screenings and vaccines  you need and how often you need them. This information is not intended to replace advice given to you by your health care provider. Make sure you discuss any questions you have with your health care provider. Document Released: 11/01/2015 Document Revised: 06/24/2016 Document Reviewed: 08/06/2015 Elsevier Interactive Patient Education  2017 [employer] Prevention in the Home Falls can cause injuries. They can happen to people of all ages. There are many things you can do to make your home safe and to help prevent falls. What can I do on the outside of my home? Regularly fix the edges of walkways and driveways and fix any cracks. Remove anything that might make you trip as you walk through a door, such as a raised step or threshold. Trim any bushes or trees on the path to your home. Use bright outdoor lighting. Clear any walking paths of anything that might make someone trip, such as rocks or tools. Regularly check to see if handrails are loose or broken. Make sure that both sides of any steps have handrails. Any raised decks and porches should have guardrails on the edges. Have any leaves, snow, or ice cleared regularly. Use sand or salt on walking paths during winter. Clean up any spills in your garage right away. This includes oil or grease spills. What can I do in the bathroom? Use night lights. Install grab bars by the toilet and in the tub and shower. Do not use towel bars as grab bars. Use non-skid mats or decals in the tub or shower. If you need to sit down in the shower, use a plastic, non-slip stool. Keep the floor dry. Clean up any water that spills on the floor as soon as it happens. Remove soap buildup in the tub or shower regularly. Attach bath mats securely with double-sided non-slip rug tape. Do not have throw rugs and other things on the floor that can make you trip. What can I do in the bedroom? Use night lights. Make sure that you have a light by your bed that  is easy to reach. Do not use any sheets or blankets that are too big for your bed. They should not hang down onto the floor. Have a firm chair that has side arms. You can use this for support while you get dressed. Do not have throw rugs and other things on the floor that can make you trip. What can I do in the kitchen? Clean up any spills right away. Avoid walking on wet floors. Keep items that you use a lot in easy-to-reach places. If you need to reach something above you, use a strong step stool that has a grab bar. Keep electrical cords out of the way. Do not use floor polish or wax that makes floors slippery. If you must use wax, use non-skid floor wax. Do not have throw rugs and other things on the floor that can make you trip. What can I do with my stairs? Do not leave any items on the stairs. Make sure that there are handrails on both sides of the stairs and use them. Fix handrails that are broken or loose. Make sure that handrails are as long as the stairways. Check any carpeting to make sure that it is firmly attached to the stairs. Fix any carpet that is loose or worn. Avoid having throw rugs at the top or bottom of the stairs. If you do  have throw rugs, attach them to the floor with carpet tape. Make sure that you have a light switch at the top of the stairs and the bottom of the stairs. If you do not have them, ask someone to add them for you. What else can I do to help prevent falls? Wear shoes that: Do not have high heels. Have rubber bottoms. Are comfortable and fit you well. Are closed at the toe. Do not wear sandals. If you use a stepladder: Make sure that it is fully opened. Do not climb a closed stepladder. Make sure that both sides of the stepladder are locked into place. Ask someone to hold it for you, if possible. Clearly mark and make sure that you can see: Any grab bars or handrails. First and last steps. Where the edge of each step is. Use tools that help you  move around (mobility aids) if they are needed. These include: Canes. Walkers. Scooters. Crutches. Turn on the lights when you go into a dark area. Replace any light bulbs as soon as they burn out. Set up your furniture so you have a clear path. Avoid moving your furniture around. If any of your floors are uneven, fix them. If there are any pets around you, be aware of where they are. Review your medicines with your doctor. Some medicines can make you feel dizzy. This can increase your chance of falling. Ask your doctor what other things that you can do to help prevent falls. This information is not intended to replace advice given to you by your health care provider. Make sure you discuss any questions you have with your health care provider. Document Released: 08/01/2009 Document Revised: 03/12/2016 Document Reviewed: 11/09/2014 Elsevier Interactive Patient Education  2017 [employer].

## 2021-07-29 NOTE — Telephone Encounter (Signed)
[name], molchan are scheduled for a virtual visit with your provider today.    Just as we do with appointments in the office, we must obtain your consent to participate.  Your consent will be active for this visit and any virtual visit you may have with one of our providers in the next 365 days.    If you have a MyChart account, I can also send a copy of this consent to you electronically.  All virtual visits are billed to your insurance company just like a traditional visit in the office.  As this is a virtual visit, video technology does not allow for your provider to perform a traditional examination.  This may limit your provider's ability to fully assess your condition.  If your provider identifies any concerns that need to be evaluated in person or the need to arrange testing such as labs, EKG, etc, we will make arrangements to do so.    Although advances in technology are sophisticated, we cannot ensure that it will always work on either your end or our end.  If the connection with a video visit is poor, we may have to switch to a telephone visit.  With either a video or telephone visit, we are not always able to ensure that we have a secure connection.   I need to obtain your verbal consent now.   Are you willing to proceed with your visit today?   Traci Gomez has provided verbal consent on 07/29/2021 for a virtual visit (video or telephone).   Elveria Royals, CMA 07/29/2021  1:32 PM

## 2021-07-29 NOTE — Progress Notes (Signed)
This service is provided via telemedicine  No vital signs collected/recorded due to the encounter was a telemedicine visit.   Location of patient (ex: home, work):  Home  Patient consents to a telephone visit:  yes, see encounter dated 07/29/2021  Location of the provider (ex: office, home):  [facility]  Name of any referring provider:  N/A  Names of all persons participating in the telemedicine service and their role in the encounter:  Abbey Chatters, Nurse Practitioner, Elveria Royals, CMA, and patient.   Time spent on call:  12 minutes with medical assistant

## 2021-07-29 NOTE — Progress Notes (Signed)
Subjective:   Traci Gomez is a 65 y.o. female who presents for Medicare Annual (Subsequent) preventive examination.  Review of Systems           Objective:    There were no vitals filed for this visit. There is no height or weight on file to calculate BMI.  Advanced Directives 07/29/2021 03/10/2021 08/12/2020 03/15/2019  Does Patient Have a Medical Advance Directive? No Yes;No No No  Does patient want to make changes to medical advance directive? - No - Patient declined - -  Would patient like information on creating a medical advance directive? - - No - Patient declined Yes (MAU/Ambulatory/Procedural Areas - Information given)    Current Medications (verified) Outpatient Encounter Medications as of 07/29/2021  Medication Sig   acetaminophen (TYLENOL) 500 MG tablet Take 500 mg by mouth as needed.   Albuterol Sulfate 2.5 MG/0.5ML NEBU Inhale 0.5 mLs (2.5 mg total) into the lungs as needed.   allopurinol (ZYLOPRIM) 100 MG tablet TAKE 1 TABLET(100 MG) BY MOUTH DAILY   amLODipine (NORVASC) 5 MG tablet TAKE 1 TABLET(5 MG) BY MOUTH DAILY   budesonide-formoterol (SYMBICORT) 80-4.5 MCG/ACT inhaler INHALE 2 PUFFS INTO THE LUNGS TWICE DAILY   famotidine (PEPCID) 20 MG tablet TAKE 1 TABLET BY MOUTH AFTER SUPPER   furosemide (LASIX) 40 MG tablet TAKE 1 AND 1/2 TABLETS BY MOUTH DAILY   metoprolol succinate (TOPROL-XL) 25 MG 24 hr tablet TAKE 1 TABLET(25 MG) BY MOUTH DAILY   montelukast (SINGULAIR) 10 MG tablet TAKE 1 TABLET(10 MG) BY MOUTH AT BEDTIME   nystatin (MYCOSTATIN/NYSTOP) powder Apply topically as needed.   nystatin cream (MYCOSTATIN) APPLY TOPICALLY TO THE AFFECTED AREA TWICE DAILY AS NEEDED FOR DRY SKIN   olmesartan (BENICAR) 20 MG tablet TAKE 1 TABLET(20 MG) BY MOUTH DAILY   pantoprazole (PROTONIX) 40 MG tablet TAKE 1 TABLET(40 MG) BY MOUTH DAILY 30 TO 60 MINUTES BEFORE FIRST MEAL OF THE DAY   potassium chloride (MICRO-K) 10 MEQ CR capsule TAKE 1 CAPSULE(10 MEQ) BY MOUTH DAILY    Vitamin D, Cholecalciferol, 25 MCG (1000 UT) CAPS Take 1,000 Units by mouth daily. (Patient taking differently: Take 1,000 Units by mouth once a week.)   albuterol (VENTOLIN HFA) 108 (90 Base) MCG/ACT inhaler Inhale 2 puffs into the lungs every 6 (six) hours as needed for wheezing or shortness of breath. (Patient not taking: Reported on 07/29/2021)   azelastine (OPTIVAR) 0.05 % ophthalmic solution Place 1 drop into both eyes 2 (two) times daily. (Patient not taking: Reported on 07/29/2021)   No facility-administered encounter medications on file as of 07/29/2021.    Allergies (verified) Bactrim [sulfamethoxazole-trimethoprim], Keflex [cephalexin], Omnicef [cefdinir], and Sulfa antibiotics   History: Past Medical History:  Diagnosis Date   Allergic rhinitis, unspecified    Arthritis    Per [facility] new patient packet    Asthma    Per [facility] new patient packet    Gout, unspecified    Hyperglycemia    Per labs completed on 03/15/2019 @ [facility]   Hyperlipidemia    Hypertension    Irregular bowel habits    Per [facility] new patient packet    Right knee injury    Stretched veins in right knee    Upper respiratory infection    Varicose vein of leg    Right, Per [facility] new patient packet    Vitamin D deficiency    Past Surgical History:  Procedure Laterality Date   TUBAL LIGATION  1990   [facility]  Family History  Problem Relation Age of Onset   High blood pressure Mother    Hypertension Mother    Heart disease Father    COPD Father    Heart disease Brother    Social History   Socioeconomic History   Marital status: Married    Spouse name: Not on file   Number of children: Not on file   Years of education: Not on file   Highest education level: Not on file  Occupational History   Not on file  Tobacco Use   Smoking status: Never   Smokeless tobacco: Never  Vaping Use   Vaping Use: Never used  Substance and Sexual Activity   Alcohol use: Not Currently   Drug use: Not  Currently   Sexual activity: Yes    Birth control/protection: None    Comment: Married  Other Topics Concern   Not on file  Social History Narrative   Per [facility] new patient packet:      Diet: N/A      Caffeine: Yes, Soda       Married, if yes what year: Yes, 1975      Do you live in a house, apartment, assisted living, condo, trailer, ect: House, 2 stories       Pets: No      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 No   Do you have difficulty preparing food or eating? No   Do you have difficulty managing your medications? No   Do you have difficulty managing your finances? No   Do you have difficulty affording your medications? No   Social Determinants of Corporate investment banker Strain: Not on file  Food Insecurity: Not on file  Transportation Needs: Not on file  Physical Activity: Not on file  Stress: Not on file  Social Connections: Not on file    Tobacco Counseling Counseling given: Not Answered   Clinical Intake:                 Diabetic?no         Activities of Daily Living No flowsheet data found.  Patient Care Team: Sharon Seller, NP as PCP - General (Geriatric Medicine)  Indicate any recent Medical Services you may have received from other than [facility] providers in the past year (date may be approximate).     Assessment:   This is a routine wellness examination for [place].  Hearing/Vision screen Hearing Screening - Comments:: Patient has no hearing problems. Vision Screening - Comments:: Patient wears glasses. Patient has had eye exam within past year. Patient sees Dr. Doreatha Lew  Dietary issues and exercise activities discussed:     Goals Addressed   None    Depression Screen PHQ 2/9 Scores 07/29/2021 08/12/2020 03/15/2019  PHQ - 2  Score 0 0 0    Fall Risk Fall Risk  07/29/2021 08/12/2020 02/12/2020 06/02/2019 03/15/2019  Falls in the past year? 0 0 0 0 0  Number falls in past yr: 0 0 0 0 0  Injury with Fall? 0 0 - 0 0  Risk for fall due to : No Fall Risks - - - -  Follow up Falls evaluation completed - - - -  FALL RISK PREVENTION PERTAINING TO THE HOME:  Any stairs in or around the home? Yes  If so, are there any without handrails? No  Home free of loose throw rugs in walkways, pet beds, electrical cords, etc? Yes  Adequate lighting in your home to reduce risk of falls? Yes   ASSISTIVE DEVICES UTILIZED TO PREVENT FALLS:  Life alert? No  Use of a cane, walker or w/c? No  Grab bars in the bathroom? No  Shower chair or bench in shower? No  Elevated toilet seat or a handicapped toilet? No   TIMED UP AND GO:  Was the test performed? No .    Cognitive Function:     6CIT Screen 07/29/2021  What Year? 0 points  What month? 0 points  What time? 0 points  Count back from 20 0 points  Months in reverse 0 points  Repeat phrase 4 points  Total Score 4    Immunizations Immunization History  Administered Date(s) Administered   Influenza,inj,Quad PF,6+ Mos 07/27/2019   Influenza-Unspecified 07/26/2020   Moderna Sars-Covid-2 Vaccination 10/26/2019, 11/26/2019, 01/28/2021   Pneumococcal Conjugate-13 08/12/2020   Tdap 09/10/2019    TDAP status: Up to date  Flu Vaccine status: Due, Education has been provided regarding the importance of this vaccine. Advised may receive this vaccine at local pharmacy or Health Dept. Aware to provide a copy of the vaccination record if obtained from local pharmacy or Health Dept. Verbalized acceptance and understanding.  Pneumococcal vaccine status: Due, Education has been provided regarding the importance of this vaccine. Advised may receive this vaccine at local pharmacy or Health Dept. Aware to provide a copy of the vaccination record if obtained from local pharmacy or  Health Dept. Verbalized acceptance and understanding.  Covid-19 vaccine status: Completed vaccines  Qualifies for Shingles Vaccine? Yes   Zostavax completed No   Shingrix Completed?: Yes  Screening Tests Health Maintenance  Topic Date Due   Zoster Vaccines- Shingrix (1 of 2) Never done   INFLUENZA VACCINE  05/19/2021   COVID-19 Vaccine (4 - Booster for Moderna series) 05/30/2021   MAMMOGRAM  01/26/2023   Fecal DNA (Cologuard)  02/14/2023   TETANUS/TDAP  09/09/2029   DEXA SCAN  Completed   HPV VACCINES  Aged Out   Hepatitis C Screening  Discontinued    Health Maintenance  Health Maintenance Due  Topic Date Due   Zoster Vaccines- Shingrix (1 of 2) Never done   INFLUENZA VACCINE  05/19/2021   COVID-19 Vaccine (4 - Booster for Moderna series) 05/30/2021    Colorectal cancer screening: Type of screening: Cologuard. Completed 2021. Repeat every 3 years  Mammogram status: Completed 4/22. Repeat every year  Bone Density status: Completed 04/17/21. Results reflect: Bone density results: NORMAL. Repeat every 2 years.  Lung Cancer Screening: (Low Dose CT Chest recommended if Age 82-80 years, 30 pack-year currently smoking OR have quit w/in 15years.) does not qualify.   Lung Cancer Screening Referral: na  Additional Screening:  Hepatitis C Screening: does qualify; Complete with next blood work  Vision Screening: Recommended annual ophthalmology exams for early detection of glaucoma and other disorders of the eye. Is the patient up to date with their annual eye exam?  Yes  Who is the provider or what is the name of the office in which the patient attends annual eye exams? [facility] If pt is not established with a provider, would they like to be referred to a provider to establish care? No .   Dental Screening: Recommended  annual dental exams for proper oral hygiene  Community Resource Referral / Chronic Care Management: CRR required this visit?  No   CCM required this visit?  No       Plan:     I have personally reviewed and noted the following in the patient's chart:   Medical and social history Use of alcohol, tobacco or illicit drugs  Current medications and supplements including opioid prescriptions.  Functional ability and status Nutritional status Physical activity Advanced directives List of other physicians Hospitalizations, surgeries, and ER visits in previous 12 months Vitals Screenings to include cognitive, depression, and falls Referrals and appointments  In addition, I have reviewed and discussed with patient certain preventive protocols, quality metrics, and best practice recommendations. A written personalized care plan for preventive services as well as general preventive health recommendations were provided to patient.     Sharon Seller, NP   07/29/2021    Virtual Visit via Telephone Note  I connected withNAME@ on 07/29/21 at  1:30 PM EDT by telephone and verified that I am speaking with the correct person using two identifiers.  Location: Patient: home Provider: [facility]    I discussed the limitations, risks, security and privacy concerns of performing an evaluation and management service by telephone and the availability of in person appointments. I also discussed with the patient that there may be a patient responsible charge related to this service. The patient expressed understanding and agreed to proceed.   I discussed the assessment and treatment plan with the patient. The patient was provided an opportunity to ask questions and all were answered. The patient agreed with the plan and demonstrated an understanding of the instructions.   The patient was advised to call back or seek an in-person evaluation if the symptoms worsen or if the condition fails to improve as anticipated.  I provided 15 minutes of non-face-to-face time during this encounter.  Janene Harvey. [employer] [employer] [address] printed and mailed

## 2021-08-21 ENCOUNTER — Other Ambulatory Visit: Payer: Self-pay | Admitting: Nurse Practitioner

## 2021-08-21 DIAGNOSIS — I83893 Varicose veins of bilateral lower extremities with other complications: Secondary | ICD-10-CM

## 2021-08-21 DIAGNOSIS — I10 Essential (primary) hypertension: Secondary | ICD-10-CM

## 2021-08-25 ENCOUNTER — Encounter: Payer: Self-pay | Admitting: Nurse Practitioner

## 2021-09-08 ENCOUNTER — Ambulatory Visit: Payer: Medicare PPO | Admitting: Nurse Practitioner

## 2021-09-08 ENCOUNTER — Encounter: Payer: Self-pay | Admitting: Nurse Practitioner

## 2021-09-08 ENCOUNTER — Other Ambulatory Visit: Payer: Self-pay

## 2021-09-08 ENCOUNTER — Other Ambulatory Visit: Payer: Medicare PPO

## 2021-09-08 DIAGNOSIS — M1A0410 Idiopathic chronic gout, right hand, without tophus (tophi): Secondary | ICD-10-CM

## 2021-09-08 DIAGNOSIS — I10 Essential (primary) hypertension: Secondary | ICD-10-CM

## 2021-09-08 DIAGNOSIS — M65342 Trigger finger, left ring finger: Secondary | ICD-10-CM

## 2021-09-08 DIAGNOSIS — R739 Hyperglycemia, unspecified: Secondary | ICD-10-CM

## 2021-09-08 DIAGNOSIS — E559 Vitamin D deficiency, unspecified: Secondary | ICD-10-CM

## 2021-09-08 DIAGNOSIS — F419 Anxiety disorder, unspecified: Secondary | ICD-10-CM

## 2021-09-08 DIAGNOSIS — J45991 Cough variant asthma: Secondary | ICD-10-CM

## 2021-09-08 DIAGNOSIS — E785 Hyperlipidemia, unspecified: Secondary | ICD-10-CM

## 2021-09-08 NOTE — Progress Notes (Signed)
Careteam: Patient Care Team: Lauree Chandler, NP as PCP - General (Geriatric Medicine)  PLACE OF SERVICE:  [place] Directive information Does Patient Have a Medical Advance Directive?: No, Would patient like information on creating a medical advance directive?: No - Patient declined  Allergies  Allergen Reactions   Bactrim [Sulfamethoxazole-Trimethoprim]    Keflex [Cephalexin]    Omnicef [Cefdinir]    Sulfa Antibiotics     Chief Complaint  Patient presents with   Medical Management of Chronic Issues    6 month follow-up. Discuss need for hep c screening and pneumonia vaccines. Patient c/o right ear ringing/fullness. Patient also c/o ring finger locking and swelling on left hand (no known injury).  Discuss frequency of OTC vit D3      HPI: Patient is a 65 y.o. female for routine follow up.  Left hand ring finger has been achy and swollen and then she notice it was locking up.  She is taking OTC Vit D but not routinely.   Reports her ear is "stopped up"  Reports there is wax in her ear and she got it washed out but they also recommended a drop and to have it flushed.   Continues to walk 30 mins a day  Htn- high bc she had to come to the office today Does not check at home.  Reports she has been nervous since last week.due to coming here.   Reports less nervous about COVID. Able to run her errands better.   Asthma- well controlled on symbicort and has not needed albuterol in a year.   GERD- well controlled on pepcid and protonix.    Review of Systems:  Review of Systems  Constitutional:  Negative for chills, fever and weight loss.  HENT:  Negative for tinnitus.   Respiratory:  Negative for cough, sputum production and shortness of breath.   Cardiovascular:  Negative for chest pain, palpitations and leg swelling.  Gastrointestinal:  Negative for abdominal pain, constipation, diarrhea and heartburn.  Genitourinary:  Negative for dysuria, frequency  and urgency.  Musculoskeletal:  Positive for myalgias. Negative for back pain, falls and joint pain.  Skin: Negative.   Neurological:  Negative for dizziness and headaches.  Psychiatric/Behavioral:  Negative for depression and memory loss. The patient is nervous/anxious. The patient does not have insomnia.    Past Medical History:  Diagnosis Date   Allergic rhinitis, unspecified    Arthritis    Per [place] new patient packet    Asthma    Per [place] new patient packet    Gout, unspecified    Hyperglycemia    Per labs completed on 03/15/2019 @ [facility]   Hyperlipidemia    Hypertension    Irregular bowel habits    Per [place] new patient packet    Right knee injury    Stretched veins in right knee    Upper respiratory infection    Varicose vein of leg    Right, Per [place] new patient packet    Vitamin D deficiency    Past Surgical History:  Procedure Laterality Date   [facility]    Social History:   reports that she has never smoked. She has never used smokeless tobacco. She reports that she does not currently use alcohol. She reports that she does not currently use drugs.  Family History  Problem Relation Age of Onset   High blood pressure Mother    Hypertension Mother  Heart disease Father    COPD Father    Heart disease Brother     Medications: Patient's Medications  New Prescriptions   No medications on file  Previous Medications   ACETAMINOPHEN (TYLENOL) 500 MG TABLET    Take 500 mg by mouth as needed.   ALBUTEROL (VENTOLIN HFA) 108 (90 BASE) MCG/ACT INHALER    Inhale 2 puffs into the lungs every 6 (six) hours as needed for wheezing or shortness of breath.   ALBUTEROL SULFATE 2.5 MG/0.5ML NEBU    Inhale 0.5 mLs (2.5 mg total) into the lungs as needed.   ALLOPURINOL (ZYLOPRIM) 100 MG TABLET    TAKE 1 TABLET(100 MG) BY MOUTH DAILY   AMLODIPINE (NORVASC) 5 MG TABLET    TAKE 1 TABLET(5 MG) BY MOUTH DAILY   AZELASTINE (OPTIVAR) 0.05 % OPHTHALMIC  SOLUTION    1 drop 2 (two) times daily as needed.   BUDESONIDE-FORMOTEROL (SYMBICORT) 80-4.5 MCG/ACT INHALER    INHALE 2 PUFFS INTO THE LUNGS TWICE DAILY   CHOLECALCIFEROL (VITAMIN D3) 25 MCG (1000 UT) CAPS    Take 1 capsule by mouth once a week.   FAMOTIDINE (PEPCID) 20 MG TABLET    TAKE 1 TABLET BY MOUTH AFTER SUPPER   FUROSEMIDE (LASIX) 40 MG TABLET    TAKE 1 AND 1/2 TABLETS BY MOUTH DAILY   METOPROLOL SUCCINATE (TOPROL-XL) 25 MG 24 HR TABLET    TAKE 1 TABLET(25 MG) BY MOUTH DAILY   MONTELUKAST (SINGULAIR) 10 MG TABLET    TAKE 1 TABLET(10 MG) BY MOUTH AT BEDTIME   NYSTATIN (MYCOSTATIN/NYSTOP) POWDER    Apply topically as needed.   NYSTATIN CREAM (MYCOSTATIN)    APPLY TOPICALLY TO THE AFFECTED AREA TWICE DAILY AS NEEDED FOR DRY SKIN   OLMESARTAN (BENICAR) 20 MG TABLET    TAKE 1 TABLET(20 MG) BY MOUTH DAILY   PANTOPRAZOLE (PROTONIX) 40 MG TABLET    TAKE 1 TABLET(40 MG) BY MOUTH DAILY 30 TO 60 MINUTES BEFORE FIRST MEAL OF THE DAY   POTASSIUM CHLORIDE (MICRO-K) 10 MEQ CR CAPSULE    TAKE 1 CAPSULE(10 MEQ) BY MOUTH DAILY  Modified Medications   No medications on file  Discontinued Medications   AZELASTINE (OPTIVAR) 0.05 % OPHTHALMIC SOLUTION    Place 1 drop into both eyes 2 (two) times daily.   VITAMIN D, CHOLECALCIFEROL, 25 MCG (1000 UT) CAPS    Take 1,000 Units by mouth daily.    Physical Exam:  Vitals:   09/08/21 0945  BP: (!) 142/94  Pulse: 75  Temp: (!) 97.3 F (36.3 C)  TempSrc: Temporal  SpO2: 99%  Weight: (!) 327 lb (148.3 kg)  Height: _0  (1.778 m)   Body mass index is 46.92 kg/m. Wt Readings from Last 3 Encounters:  09/08/21 (!) 327 lb (148.3 kg)  03/10/21 (!) 325 lb (147.4 kg)  03/10/21 (!) 325 lb (147.4 kg)    Physical Exam Constitutional:      General: She is not in acute distress.    Appearance: She is well-developed. She is not diaphoretic.  HENT:     Head: Normocephalic and atraumatic.     Mouth/Throat:     Pharynx: No oropharyngeal exudate.  Eyes:      Conjunctiva/sclera: Conjunctivae normal.     Pupils: Pupils are equal, round, and reactive to light.  Cardiovascular:     Rate and Rhythm: Normal rate and regular rhythm.     Heart sounds: Normal heart sounds.  Pulmonary:     Effort:  Pulmonary effort is normal.     Breath sounds: Normal breath sounds.  Abdominal:     General: Bowel sounds are normal.     Palpations: Abdomen is soft.  Musculoskeletal:     Left hand: Decreased range of motion (noted to ring finger).     Cervical back: Normal range of motion and neck supple.     Right lower leg: No edema.     Left lower leg: No edema.  Skin:    General: Skin is warm and dry.  Neurological:     Mental Status: She is alert and oriented to person, place, and time. Mental status is at baseline.  Psychiatric:        Mood and Affect: Mood normal.    Labs reviewed: Basic Metabolic Panel: Recent Labs    03/10/21 1023  NA 142  K 4.1  CL 104  CO2 28  GLUCOSE 103*  BUN 20  CREATININE 1.12*  CALCIUM 9.2   Liver Function Tests: Recent Labs    03/10/21 1023  AST 21  ALT 16  BILITOT 0.6  PROT 6.9   No results for input(s): LIPASE, AMYLASE in the last 8760 hours. No results for input(s): AMMONIA in the last 8760 hours. CBC: Recent Labs    03/10/21 1023  WBC 3.7*  NEUTROABS 1,643  HGB 14.0  HCT 43.4  MCV 86.1  PLT 260   Lipid Panel: Recent Labs    03/10/21 1023  CHOL 159  HDL 58  LDLCALC 82  TRIG 91  CHOLHDL 2.7   TSH: No results for input(s): TSH in the last 8760 hours. A1C: Lab Results  Component Value Date   HGBA1C 6.0 (H) 03/10/2021     Assessment/Plan 1. 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 Essential hypertension -improved on recheck.  -stable. Goal bp <140/90. Continue on current regimen with low sodium diet.  - CBC with Differential/Platelet - CMP with  eGFR([facility])  3. Hyperlipidemia, unspecified hyperlipidemia type -continue dietary modifications.  - Lipid Panel  4. Chronic gout of right hand, unspecified cause -continues on allopurinol with dietary modifications.  - Uric Acid  5. Hyperglycemia -continue dietary modifications.  - Hemoglobin A1c  6. Vitamin D deficiency Continue vit d 1000 units daily   7. Cough variant asthma -well controlled on current regimen.   8. Anxiety -ongoing but overall has improved with lifestyle modifications.   9. Trigger finger Information provided. She declines referral for ortho at this time and states she will get a brace   Next appt: 6 months, sooner if needed  Raynell Upton K. [place], [facility] [phone]

## 2021-09-08 NOTE — Patient Instructions (Signed)
Low carb, low sugar  High protein, high fiber.

## 2021-09-09 LAB — CBC WITH DIFFERENTIAL/PLATELET
Absolute Monocytes: 350 cells/uL (ref 200–950)
Basophils Absolute: 31 cells/uL (ref 0–200)
Basophils Relative: 0.9 %
Eosinophils Absolute: 51 cells/uL (ref 15–500)
Eosinophils Relative: 1.5 %
HCT: 45.2 % — ABNORMAL HIGH (ref 35.0–45.0)
Hemoglobin: 14.7 g/dL (ref 11.7–15.5)
Lymphs Abs: 1826 cells/uL (ref 850–3900)
MCH: 27.5 pg (ref 27.0–33.0)
MCHC: 32.5 g/dL (ref 32.0–36.0)
MCV: 84.5 fL (ref 80.0–100.0)
MPV: 10.9 fL (ref 7.5–12.5)
Monocytes Relative: 10.3 %
Neutro Abs: 1142 cells/uL — ABNORMAL LOW (ref 1500–7800)
Neutrophils Relative %: 33.6 %
Platelets: 272 10*3/uL (ref 140–400)
RBC: 5.35 10*6/uL — ABNORMAL HIGH (ref 3.80–5.10)
RDW: 12.8 % (ref 11.0–15.0)
Total Lymphocyte: 53.7 %
WBC: 3.4 10*3/uL — ABNORMAL LOW (ref 3.8–10.8)

## 2021-09-09 LAB — COMPLETE METABOLIC PANEL WITH GFR
AG Ratio: 1.3 (calc) (ref 1.0–2.5)
ALT: 15 U/L (ref 6–29)
AST: 20 U/L (ref 10–35)
Albumin: 4.1 g/dL (ref 3.6–5.1)
Alkaline phosphatase (APISO): 74 U/L (ref 37–153)
BUN/Creatinine Ratio: 17 (calc) (ref 6–22)
BUN: 20 mg/dL (ref 7–25)
CO2: 32 mmol/L (ref 20–32)
Calcium: 9.4 mg/dL (ref 8.6–10.4)
Chloride: 105 mmol/L (ref 98–110)
Creat: 1.17 mg/dL — ABNORMAL HIGH (ref 0.50–1.05)
Globulin: 3.1 g/dL (calc) (ref 1.9–3.7)
Glucose, Bld: 99 mg/dL (ref 65–99)
Potassium: 3.9 mmol/L (ref 3.5–5.3)
Sodium: 143 mmol/L (ref 135–146)
Total Bilirubin: 0.8 mg/dL (ref 0.2–1.2)
Total Protein: 7.2 g/dL (ref 6.1–8.1)
eGFR: 51 mL/min/{1.73_m2} — ABNORMAL LOW (ref >=60)

## 2021-09-09 LAB — LIPID PANEL
Cholesterol: 164 mg/dL (ref <200)
HDL: 57 mg/dL (ref >=50)
LDL Cholesterol (Calc): 85 mg/dL (calc)
Non-HDL Cholesterol (Calc): 107 mg/dL (calc) (ref <130)
Total CHOL/HDL Ratio: 2.9 (calc) (ref <5.0)
Triglycerides: 120 mg/dL (ref <150)

## 2021-09-09 LAB — HEMOGLOBIN A1C
Hgb A1c MFr Bld: 6 % of total Hgb — ABNORMAL HIGH (ref <5.7)
Mean Plasma Glucose: 126 mg/dL
eAG (mmol/L): 7 mmol/L

## 2021-09-09 LAB — URIC ACID: Uric Acid, Serum: 5.8 mg/dL (ref 2.5–7.0)

## 2021-09-10 ENCOUNTER — Other Ambulatory Visit: Payer: Medicare PPO

## 2021-09-10 ENCOUNTER — Ambulatory Visit: Payer: Medicare PPO | Admitting: Nurse Practitioner

## 2021-09-20 ENCOUNTER — Other Ambulatory Visit: Payer: Self-pay | Admitting: Nurse Practitioner

## 2021-09-20 DIAGNOSIS — I10 Essential (primary) hypertension: Secondary | ICD-10-CM

## 2021-09-20 DIAGNOSIS — I83893 Varicose veins of bilateral lower extremities with other complications: Secondary | ICD-10-CM

## 2021-11-26 ENCOUNTER — Encounter: Payer: Self-pay | Admitting: Nurse Practitioner

## 2021-11-26 ENCOUNTER — Other Ambulatory Visit: Payer: Self-pay | Admitting: Nurse Practitioner

## 2021-11-26 DIAGNOSIS — J45991 Cough variant asthma: Secondary | ICD-10-CM

## 2021-11-26 DIAGNOSIS — I83893 Varicose veins of bilateral lower extremities with other complications: Secondary | ICD-10-CM

## 2021-11-26 DIAGNOSIS — M1A0410 Idiopathic chronic gout, right hand, without tophus (tophi): Secondary | ICD-10-CM

## 2021-11-26 DIAGNOSIS — Z1211 Encounter for screening for malignant neoplasm of colon: Secondary | ICD-10-CM

## 2021-11-26 DIAGNOSIS — I10 Essential (primary) hypertension: Secondary | ICD-10-CM

## 2021-11-27 MED ORDER — FAMOTIDINE 20 MG PO TABS: ORAL_TABLET | ORAL | 1 refills | Status: DC

## 2021-11-27 MED ORDER — POTASSIUM CHLORIDE ER 10 MEQ PO CPCR: ORAL_CAPSULE | ORAL | 1 refills | Status: DC

## 2021-11-27 MED ORDER — ALLOPURINOL 100 MG PO TABS: ORAL_TABLET | ORAL | 1 refills | Status: DC

## 2021-11-27 NOTE — Telephone Encounter (Signed)
Patient requested refill.  Pended Rx and sent to [facility] for approval.  

## 2022-01-07 ENCOUNTER — Encounter: Payer: Self-pay | Admitting: Nurse Practitioner

## 2022-02-17 ENCOUNTER — Other Ambulatory Visit: Payer: Self-pay | Admitting: Nurse Practitioner

## 2022-02-17 DIAGNOSIS — I10 Essential (primary) hypertension: Secondary | ICD-10-CM

## 2022-02-24 ENCOUNTER — Other Ambulatory Visit: Payer: Self-pay | Admitting: Nurse Practitioner

## 2022-02-24 DIAGNOSIS — I83893 Varicose veins of bilateral lower extremities with other complications: Secondary | ICD-10-CM

## 2022-02-24 DIAGNOSIS — I10 Essential (primary) hypertension: Secondary | ICD-10-CM

## 2022-03-20 ENCOUNTER — Ambulatory Visit: Payer: Medicare PPO | Admitting: Nurse Practitioner

## 2022-04-03 NOTE — Patient Instructions (Signed)
Please contact your local pharmacy, previous provider, or insurance carrier for vaccine/immunization records. Ensure that any procedures done outside of [facility] are faxed to [place] [phone] or you can sign release of records form at the front desk to keep your medical record updated.     Changing diet for burping/acid reflex  Increase activity and decrease sugars/carbs

## 2022-04-04 ENCOUNTER — Other Ambulatory Visit: Payer: Self-pay | Admitting: Nurse Practitioner

## 2022-04-04 DIAGNOSIS — I83893 Varicose veins of bilateral lower extremities with other complications: Secondary | ICD-10-CM

## 2022-04-04 DIAGNOSIS — I10 Essential (primary) hypertension: Secondary | ICD-10-CM

## 2022-04-06 ENCOUNTER — Ambulatory Visit: Payer: Medicare PPO | Admitting: Nurse Practitioner

## 2022-04-06 ENCOUNTER — Encounter: Payer: Self-pay | Admitting: Nurse Practitioner

## 2022-04-06 VITALS — BP 140/80 | HR 50 | Temp 97.5°F | Resp 18 | Ht 70.0 in | Wt 327.6 lb

## 2022-04-06 DIAGNOSIS — M1A0410 Idiopathic chronic gout, right hand, without tophus (tophi): Secondary | ICD-10-CM

## 2022-04-06 DIAGNOSIS — E785 Hyperlipidemia, unspecified: Secondary | ICD-10-CM

## 2022-04-06 DIAGNOSIS — R739 Hyperglycemia, unspecified: Secondary | ICD-10-CM

## 2022-04-06 DIAGNOSIS — I10 Essential (primary) hypertension: Secondary | ICD-10-CM | POA: Diagnosis not present

## 2022-04-06 DIAGNOSIS — J45991 Cough variant asthma: Secondary | ICD-10-CM | POA: Diagnosis not present

## 2022-04-06 DIAGNOSIS — E559 Vitamin D deficiency, unspecified: Secondary | ICD-10-CM

## 2022-04-06 NOTE — Progress Notes (Signed)
Careteam: Patient Care Team: Lauree Chandler, NP as PCP - General (Geriatric Medicine)  PLACE OF SERVICE:  [place] Directive information Does Patient Have a Medical Advance Directive?: No, Would patient like information on creating a medical advance directive?: No - Patient declined  Allergies  Allergen Reactions   Bactrim [Sulfamethoxazole-Trimethoprim]    Keflex [Cephalexin]    Omnicef [Cefdinir]    Sulfa Antibiotics     Chief Complaint  Patient presents with   Medical Management of Chronic Issues    6 month follow up & Fasting labs.   Immunizations    Discuss the need for [employer].     HPI: Patient is a 66 y.o. female for routine follow up.   Vit d def- continues on supplement  Does a lot of walking with her husband during softball season. She has tired to change diet, decreased sweets.   Uric acid at goal on last labs- continues on allopurinol 100 mg daily- no flares.   GERD_ burping more. Continues on protonix and pepcid. She reports she talked to her sister and decided to change her diet.   Htn- did not take medication today.   Asthma- doing well- no flares.   Review of Systems:  Review of Systems  Constitutional:  Negative for chills, fever and weight loss.  HENT:  Negative for tinnitus.   Respiratory:  Negative for cough, sputum production and shortness of breath.   Cardiovascular:  Negative for chest pain, palpitations and leg swelling.  Gastrointestinal:  Positive for heartburn. Negative for abdominal pain, constipation and diarrhea.  Genitourinary:  Negative for dysuria, frequency and urgency.  Musculoskeletal:  Negative for back pain, falls, joint pain and myalgias.  Skin: Negative.   Neurological:  Negative for dizziness and headaches.  Psychiatric/Behavioral:  Negative for depression and memory loss. The patient does not have insomnia.     Past Medical History:  Diagnosis Date   Allergic rhinitis, unspecified     Arthritis    Per [place] new patient packet    Asthma    Per [place] new patient packet    Gout, unspecified    Hyperglycemia    Per labs completed on 03/15/2019 @ [place]   Hyperlipidemia    Hypertension    Irregular bowel habits    Per [place] new patient packet    Right knee injury    Stretched veins in right knee    Upper respiratory infection    Varicose vein of leg    Right, Per [place] new patient packet    Vitamin D deficiency    Past Surgical History:  Procedure Laterality Date   [facility]    Social History:   reports that she has never smoked. She has never used smokeless tobacco. She reports that she does not currently use alcohol. She reports that she does not currently use drugs.  Family History  Problem Relation Age of Onset   High blood pressure Mother    Hypertension Mother    Heart disease Father    COPD Father    Heart disease Brother     Medications: Patient's Medications  New Prescriptions   No medications on file  Previous Medications   ACETAMINOPHEN (TYLENOL) 500 MG TABLET    Take 500 mg by mouth as needed.   ALBUTEROL (VENTOLIN HFA) 108 (90 BASE) MCG/ACT INHALER    Inhale 2 puffs into the lungs every 6 (six) hours as needed for  wheezing or shortness of breath.   ALBUTEROL SULFATE 2.5 MG/0.5ML NEBU    Inhale 0.5 mLs (2.5 mg total) into the lungs as needed.   ALLOPURINOL (ZYLOPRIM) 100 MG TABLET    TAKE 1 TABLET(100 MG) BY MOUTH DAILY   AMLODIPINE (NORVASC) 5 MG TABLET    TAKE 1 TABLET(5 MG) BY MOUTH DAILY   AZELASTINE (OPTIVAR) 0.05 % OPHTHALMIC SOLUTION    1 drop 2 (two) times daily as needed.   BUDESONIDE-FORMOTEROL (SYMBICORT) 80-4.5 MCG/ACT INHALER    INHALE 2 PUFFS INTO THE LUNGS TWICE DAILY   CHOLECALCIFEROL (VITAMIN D3 PO)    Take 1 capsule by mouth daily.   FAMOTIDINE (PEPCID) 20 MG TABLET    TAKE 1 TABLET BY MOUTH AFTER SUPPER   FUROSEMIDE (LASIX) 40 MG TABLET    TAKE 1 AND 1/2 TABLETS BY MOUTH DAILY   METOPROLOL  SUCCINATE (TOPROL-XL) 25 MG 24 HR TABLET    TAKE 1 TABLET(25 MG) BY MOUTH DAILY   MONTELUKAST (SINGULAIR) 10 MG TABLET    TAKE 1 TABLET(10 MG) BY MOUTH AT BEDTIME   NYSTATIN (MYCOSTATIN/NYSTOP) POWDER    Apply topically as needed.   NYSTATIN CREAM (MYCOSTATIN)    APPLY TOPICALLY TO THE AFFECTED AREA TWICE DAILY AS NEEDED FOR DRY SKIN   OLMESARTAN (BENICAR) 20 MG TABLET    TAKE 1 TABLET(20 MG) BY MOUTH DAILY   PANTOPRAZOLE (PROTONIX) 40 MG TABLET    TAKE 1 TABLET(40 MG) BY MOUTH DAILY 30 TO 60 MINUTES BEFORE FIRST MEAL OF THE DAY   POTASSIUM CHLORIDE (MICRO-K) 10 MEQ CR CAPSULE    TAKE 1 CAPSULE(10 MEQ) BY MOUTH DAILY  Modified Medications   No medications on file  Discontinued Medications   CHOLECALCIFEROL (VITAMIN D3) 25 MCG (1000 UT) CAPS    Take 1 capsule by mouth once a week.    Physical Exam:  Vitals:   04/06/22 0810  BP: 140/80  Pulse: (!) 50  Resp: 18  Temp: (!) 97.5 F (36.4 C)  SpO2: 96%  Weight: (!) 327 lb 9.6 oz (148.6 kg)  Height: '5\' 10"'  (1.778 m)   Body mass index is 47.01 kg/m. Wt Readings from Last 3 Encounters:  04/06/22 (!) 327 lb 9.6 oz (148.6 kg)  09/08/21 (!) 327 lb (148.3 kg)  03/10/21 (!) 325 lb (147.4 kg)    Physical Exam Constitutional:      General: She is not in acute distress.    Appearance: She is well-developed. She is obese. She is not diaphoretic.  HENT:     Head: Normocephalic and atraumatic.     Mouth/Throat:     Pharynx: No oropharyngeal exudate.  Eyes:     Conjunctiva/sclera: Conjunctivae normal.     Pupils: Pupils are equal, round, and reactive to light.  Cardiovascular:     Rate and Rhythm: Normal rate and regular rhythm.     Heart sounds: Normal heart sounds.  Pulmonary:     Effort: Pulmonary effort is normal.     Breath sounds: Normal breath sounds.  Abdominal:     General: Bowel sounds are normal.     Palpations: Abdomen is soft.  Musculoskeletal:     Cervical back: Normal range of motion and neck supple.     Right lower  leg: No edema.     Left lower leg: No edema.  Skin:    General: Skin is warm and dry.  Neurological:     Mental Status: She is alert.  Psychiatric:  Mood and Affect: Mood normal.     Labs reviewed: Basic Metabolic Panel: Recent Labs    09/08/21 1023  NA 143  K 3.9  CL 105  CO2 32  GLUCOSE 99  BUN 20  CREATININE 1.17*  CALCIUM 9.4   Liver Function Tests: Recent Labs    09/08/21 1023  AST 20  ALT 15  BILITOT 0.8  PROT 7.2   No results for input(s): "LIPASE", "AMYLASE" in the last 8760 hours. No results for input(s): "AMMONIA" in the last 8760 hours. CBC: Recent Labs    09/08/21 1023  WBC 3.4*  NEUTROABS 1,142*  HGB 14.7  HCT 45.2*  MCV 84.5  PLT 272   Lipid Panel: Recent Labs    09/08/21 1023  CHOL 164  HDL 57  LDLCALC 85  TRIG 120  CHOLHDL 2.9   TSH: No results for input(s): "TSH" in the last 8760 hours. A1C: Lab Results  Component Value Date   HGBA1C 6.0 (H) 09/08/2021     Assessment/Plan 1. Cough variant asthma -controlled at this time. Continues singulair  2. Essential hypertension -Blood pressure well controlled Continue current medications Recheck metabolic panel - CBC with Differential/Platelet - CMP with eGFR([facility])  3. Obesity, morbid (more than 100 lbs over ideal weight or BMI > 40) (HCC) --education provided on healthy weight loss through increase in physical activity and proper nutrition   4. Chronic gout of right hand, unspecified cause No recent flares.  - 5. Hyperlipidemia, unspecified hyperlipidemia type -continue dietary modifications.  - CMP with eGFR([facility]) - Lipid panel  6. Hyperglycemia -dietary modifications and increase in physical activity recommended.  - Hemoglobin A1c  7. Vitamin D deficiency Continues supplement.  - Vitamin D, 25-hydroxy   Return in about 6 months (around 10/06/2022) for routine follow up- labs at appt. Carlos American. [place], [facility] [phone]

## 2022-04-07 ENCOUNTER — Other Ambulatory Visit: Payer: Self-pay | Admitting: Nurse Practitioner

## 2022-04-07 DIAGNOSIS — I10 Essential (primary) hypertension: Secondary | ICD-10-CM

## 2022-04-07 LAB — CBC WITH DIFFERENTIAL/PLATELET
Absolute Monocytes: 403 cells/uL (ref 200–950)
Basophils Absolute: 30 cells/uL (ref 0–200)
Basophils Relative: 0.9 %
Eosinophils Absolute: 59 cells/uL (ref 15–500)
Eosinophils Relative: 1.8 %
HCT: 42.7 % (ref 35.0–45.0)
Hemoglobin: 13.8 g/dL (ref 11.7–15.5)
Lymphs Abs: 1709 cells/uL (ref 850–3900)
MCH: 28.2 pg (ref 27.0–33.0)
MCHC: 32.3 g/dL (ref 32.0–36.0)
MCV: 87.1 fL (ref 80.0–100.0)
MPV: 11.3 fL (ref 7.5–12.5)
Monocytes Relative: 12.2 %
Neutro Abs: 1099 cells/uL — ABNORMAL LOW (ref 1500–7800)
Neutrophils Relative %: 33.3 %
Platelets: 233 10*3/uL (ref 140–400)
RBC: 4.9 10*6/uL (ref 3.80–5.10)
RDW: 13.1 % (ref 11.0–15.0)
Total Lymphocyte: 51.8 %
WBC: 3.3 10*3/uL — ABNORMAL LOW (ref 3.8–10.8)

## 2022-04-07 LAB — COMPLETE METABOLIC PANEL WITH GFR
AG Ratio: 1.5 (calc) (ref 1.0–2.5)
ALT: 11 U/L (ref 6–29)
AST: 15 U/L (ref 10–35)
Albumin: 4 g/dL (ref 3.6–5.1)
Alkaline phosphatase (APISO): 70 U/L (ref 37–153)
BUN/Creatinine Ratio: 18 (calc) (ref 6–22)
BUN: 21 mg/dL (ref 7–25)
CO2: 28 mmol/L (ref 20–32)
Calcium: 9.1 mg/dL (ref 8.6–10.4)
Chloride: 107 mmol/L (ref 98–110)
Creat: 1.16 mg/dL — ABNORMAL HIGH (ref 0.50–1.05)
Globulin: 2.6 g/dL (calc) (ref 1.9–3.7)
Glucose, Bld: 99 mg/dL (ref 65–99)
Potassium: 4.3 mmol/L (ref 3.5–5.3)
Sodium: 142 mmol/L (ref 135–146)
Total Bilirubin: 0.5 mg/dL (ref 0.2–1.2)
Total Protein: 6.6 g/dL (ref 6.1–8.1)
eGFR: 52 mL/min/{1.73_m2} — ABNORMAL LOW (ref >=60)

## 2022-04-07 LAB — LIPID PANEL
Cholesterol: 151 mg/dL (ref <200)
HDL: 62 mg/dL (ref >=50)
LDL Cholesterol (Calc): 74 mg/dL (calc)
Non-HDL Cholesterol (Calc): 89 mg/dL (calc) (ref <130)
Total CHOL/HDL Ratio: 2.4 (calc) (ref <5.0)
Triglycerides: 69 mg/dL (ref <150)

## 2022-04-07 LAB — HEMOGLOBIN A1C
Hgb A1c MFr Bld: 5.8 % of total Hgb — ABNORMAL HIGH (ref <5.7)
Mean Plasma Glucose: 120 mg/dL
eAG (mmol/L): 6.6 mmol/L

## 2022-04-07 LAB — VITAMIN D 25 HYDROXY (VIT D DEFICIENCY, FRACTURES): Vit D, 25-Hydroxy: 43 ng/mL (ref 30–100)

## 2022-05-04 ENCOUNTER — Encounter: Payer: Self-pay | Admitting: Nurse Practitioner

## 2022-05-04 DIAGNOSIS — J45991 Cough variant asthma: Secondary | ICD-10-CM

## 2022-05-04 MED ORDER — ALBUTEROL SULFATE HFA 108 (90 BASE) MCG/ACT IN AERS: 2.0000 | INHALATION_SPRAY | Freq: Four times a day (QID) | RESPIRATORY_TRACT | 0 refills | Status: DC | PRN

## 2022-05-04 NOTE — Telephone Encounter (Signed)
Immunizations updated.  Pended Rx and sent to [facility] for approval due to HIGH ALERT Warning.

## 2022-05-12 MED ORDER — BUDESONIDE-FORMOTEROL FUMARATE 80-4.5 MCG/ACT IN AERO: 2.0000 | INHALATION_SPRAY | Freq: Two times a day (BID) | RESPIRATORY_TRACT | 1 refills | Status: DC

## 2022-05-24 ENCOUNTER — Other Ambulatory Visit: Payer: Self-pay | Admitting: Nurse Practitioner

## 2022-05-24 DIAGNOSIS — J45991 Cough variant asthma: Secondary | ICD-10-CM

## 2022-05-30 ENCOUNTER — Other Ambulatory Visit: Payer: Self-pay | Admitting: Nurse Practitioner

## 2022-06-01 ENCOUNTER — Encounter: Payer: Self-pay | Admitting: Nurse Practitioner

## 2022-06-11 ENCOUNTER — Other Ambulatory Visit: Payer: Self-pay | Admitting: Nurse Practitioner

## 2022-07-06 ENCOUNTER — Other Ambulatory Visit: Payer: Self-pay | Admitting: Nurse Practitioner

## 2022-07-06 DIAGNOSIS — J45991 Cough variant asthma: Secondary | ICD-10-CM

## 2022-08-22 ENCOUNTER — Other Ambulatory Visit: Payer: Self-pay | Admitting: Nurse Practitioner

## 2022-08-22 DIAGNOSIS — M1A0410 Idiopathic chronic gout, right hand, without tophus (tophi): Secondary | ICD-10-CM

## 2022-09-07 ENCOUNTER — Encounter: Payer: Self-pay | Admitting: Nurse Practitioner

## 2022-09-07 ENCOUNTER — Telehealth: Payer: Self-pay

## 2022-09-07 ENCOUNTER — Ambulatory Visit (INDEPENDENT_AMBULATORY_CARE_PROVIDER_SITE_OTHER): Payer: Medicare PPO | Admitting: Nurse Practitioner

## 2022-09-07 VITALS — Ht 70.0 in | Wt 335.0 lb

## 2022-09-07 DIAGNOSIS — Z0000 Encounter for general adult medical examination without abnormal findings: Secondary | ICD-10-CM

## 2022-09-07 NOTE — Progress Notes (Signed)
Subjective:   Traci Gomez is a 66 y.o. female who presents for Medicare Annual (Subsequent) preventive examination.  Review of Systems     Cardiac Risk Factors include: obesity (BMI >30kg/m2);dyslipidemia;hypertension;sedentary lifestyle;advanced age (>70men, >53 women)     Objective:    Today's Vitals   09/07/22 1356  Weight: (!) 335 lb (152 kg)  Height: 5\' 10"  (1.778 m)   Body mass index is 48.07 kg/m.     04/06/2022    8:17 AM 09/08/2021    9:45 AM 07/29/2021    1:18 PM 03/10/2021    9:53 AM 08/12/2020    9:31 AM 03/15/2019    8:31 AM  Advanced Directives  Does Patient Have a Medical Advance Directive? No No No Yes;No No No  Does patient want to make changes to medical advance directive?    No - Patient declined    Would patient like information on creating a medical advance directive? No - Patient declined No - Patient declined   No - Patient declined Yes (MAU/Ambulatory/Procedural Areas - Information given)    Current Medications (verified) Outpatient Encounter Medications as of 09/07/2022  Medication Sig   acetaminophen (TYLENOL) 500 MG tablet Take 500 mg by mouth as needed.   albuterol (VENTOLIN HFA) 108 (90 Base) MCG/ACT inhaler INHALE 2 PUFFS BY MOUTH EVERY 6 HOURS AS NEEDED FOR WHEEZING OR SHORTNESS OF BREATH   Albuterol Sulfate 2.5 MG/0.5ML NEBU Inhale 0.5 mLs (2.5 mg total) into the lungs as needed.   allopurinol (ZYLOPRIM) 100 MG tablet TAKE 1 TABLET(100 MG) BY MOUTH DAILY   amLODipine (NORVASC) 5 MG tablet TAKE 1 TABLET(5 MG) BY MOUTH DAILY   azelastine (OPTIVAR) 0.05 % ophthalmic solution 1 drop 2 (two) times daily as needed.   budesonide-formoterol (SYMBICORT) 80-4.5 MCG/ACT inhaler Inhale 2 puffs into the lungs 2 (two) times daily.   Cholecalciferol (VITAMIN D3 PO) Take 1 capsule by mouth daily.   clindamycin (CLEOCIN) 300 MG capsule Take 300 mg by mouth every 6 (six) hours.   famotidine (PEPCID) 20 MG tablet TAKE 1 TABLET BY MOUTH AFTER SUPPER    furosemide (LASIX) 40 MG tablet TAKE 1 AND 1/2 TABLETS BY MOUTH DAILY   metoprolol succinate (TOPROL-XL) 25 MG 24 hr tablet TAKE 1 TABLET(25 MG) BY MOUTH DAILY   montelukast (SINGULAIR) 10 MG tablet TAKE 1 TABLET(10 MG) BY MOUTH AT BEDTIME   nystatin (MYCOSTATIN/NYSTOP) powder Apply topically as needed.   nystatin cream (MYCOSTATIN) APPLY TOPICALLY TO THE AFFECTED AREA TWICE DAILY AS NEEDED FOR DRY SKIN   olmesartan (BENICAR) 20 MG tablet TAKE 1 TABLET(20 MG) BY MOUTH DAILY   pantoprazole (PROTONIX) 40 MG tablet TAKE 1 TABLET(40 MG) BY MOUTH DAILY 30 TO 60 MINUTES BEFORE FIRST MEAL OF THE DAY   potassium chloride (MICRO-K) 10 MEQ CR capsule TAKE 1 CAPSULE(10 MEQ) BY MOUTH DAILY   No facility-administered encounter medications on file as of 09/07/2022.    Allergies (verified) Bactrim [sulfamethoxazole-trimethoprim], Keflex [cephalexin], Omnicef [cefdinir], and Sulfa antibiotics   History: Past Medical History:  Diagnosis Date   Allergic rhinitis, unspecified    Arthritis    Per [facility] new patient packet    Asthma    Per [facility] new patient packet    Gout, unspecified    Hyperglycemia    Per labs completed on 03/15/2019 @ [facility]   Hyperlipidemia    Hypertension    Irregular bowel habits    Per [facility] new patient packet    Right knee injury    Stretched  veins in right knee    Upper respiratory infection    Varicose vein of leg    Right, Per [facility] new patient packet    Vitamin D deficiency    Past Surgical History:  Procedure Laterality Date   [facility]    Family History  Problem Relation Age of Onset   High blood pressure Mother    Hypertension Mother    Heart disease Father    COPD Father    Heart disease Brother    Social History   Socioeconomic History   Marital status: Married    Spouse name: Not on file   Number of children: Not on file   Years of education: Not on file   Highest education level: Not on file  Occupational History    Not on file  Tobacco Use   Smoking status: Never   Smokeless tobacco: Never  Vaping Use   Vaping Use: Never used  Substance and Sexual Activity   Alcohol use: Not Currently   Drug use: Not Currently   Sexual activity: Yes    Birth control/protection: None    Comment: Married  Other Topics Concern   Not on file  Social History Narrative   Per [facility] new patient packet:      Diet: N/A      Caffeine: Yes, Soda       Married, if yes what year: Yes, 1975      Do you live in a house, apartment, assisted living, condo, trailer, ect: House, 2 stories       Pets: No      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 No   Do you have difficulty preparing food or eating? No   Do you have difficulty managing your medications? No   Do you have difficulty managing your finances? No   Do you have difficulty affording your medications? No   Social Determinants of Corporate investment bankerHealth   Financial Resource Strain: Not on file  Food Insecurity: Not on file  Transportation Needs: Not on file  Physical Activity: Not on file  Stress: Not on file  Social Connections: Not on file    Tobacco Counseling Counseling given: Not Answered   Clinical Intake:  Pre-visit preparation completed: Yes  Pain : No/denies pain     BMI - recorded: 48 Diabetes: No  How often do you need to have someone help you when you read instructions, pamphlets, or other written materials from your doctor or pharmacy?: 1 - Never  Diabetic?no         Activities of Daily Living    09/07/2022    2:17 PM  In your present state of health, do you have any difficulty performing the following activities:  Hearing? 0  Vision? 0  Difficulty concentrating or making decisions? 0  Walking or climbing stairs? 0  Dressing or bathing? 0  Doing  errands, shopping? 0  Preparing Food and eating ? N  Using the Toilet? N  In the past six months, have you accidently leaked urine? Y  Managing your Medications? N  Managing your Finances? N  Housekeeping or managing your Housekeeping? N    Patient Care Team: Janyth Contesubanks,  Janene Harvey, NP as PCP - General (Geriatric Medicine)  Indicate any recent Medical Services you may have received from other than [facility] providers in the past year (date may be approximate).     Assessment:   This is a routine wellness examination for [place].  Hearing/Vision screen Hearing Screening - Comments:: No concerns Vision Screening - Comments:: Last eye exam in May 2023, Dr Kern Reap  Dietary issues and exercise activities discussed: Current Exercise Habits: The patient does not participate in regular exercise at present   Goals Addressed   None    Depression Screen    09/07/2022    1:58 PM 09/08/2021    9:45 AM 07/29/2021    1:15 PM 08/12/2020    9:30 AM 03/15/2019    8:32 AM  PHQ 2/9 Scores  PHQ - 2 Score 0 0 0 0 0    Fall Risk    09/07/2022    1:58 PM 04/06/2022    8:17 AM 09/08/2021    9:45 AM 07/29/2021    1:16 PM 08/12/2020    9:30 AM  Fall Risk   Falls in the past year? 0 0 0 0 0  Number falls in past yr:  0 0 0 0  Injury with Fall?  0 0 0 0  Risk for fall due to :  No Fall Risks No Fall Risks No Fall Risks   Follow up  Falls evaluation completed Falls evaluation completed Falls evaluation completed     FALL RISK PREVENTION PERTAINING TO THE HOME:  Any stairs in or around the home? Yes  If so, are there any without handrails?  no Home free of loose throw rugs in walkways, pet beds, electrical cords, etc? Yes  Adequate lighting in your home to reduce risk of falls? Yes   ASSISTIVE DEVICES UTILIZED TO PREVENT FALLS:  Life alert? No  Use of a cane, walker or w/c? No  Grab bars in the bathroom? No  Shower chair or bench in shower? No  Elevated toilet seat or a handicapped toilet? No    TIMED UP AND GO:  Was the test performed? No .    Cognitive Function:        09/07/2022    1:59 PM 07/29/2021    1:18 PM  6CIT Screen  What Year? 0 points 0 points  What month? 0 points 0 points  What time? 0 points 0 points  Count back from 20 0 points 0 points  Months in reverse 0 points 0 points  Repeat phrase 0 points 4 points  Total Score 0 points 4 points    Immunizations Immunization History  Administered Date(s) Administered   Covid-19, Mrna,Vaccine(Spikevax)60yrs and older 07/21/2022   Influenza, High Dose Seasonal PF 08/06/2021   Influenza,inj,Quad PF,6+ Mos 07/27/2019   Influenza-Unspecified 07/26/2020, 06/19/2022   Moderna Covid-19 Vaccine Bivalent Booster 63yrs & up 04/29/2022   Moderna Sars-Covid-2 Vaccination 10/25/2019, 11/23/2019, 09/04/2020, 01/28/2021, 07/24/2021   PNEUMOCOCCAL CONJUGATE-20 08/27/2021   Pneumococcal Conjugate-13 08/12/2020   Tdap 09/10/2019   Zoster Recombinat (Shingrix) 04/28/2021, 06/27/2021    TDAP status: Up to date  Flu Vaccine status: Up to date  Pneumococcal vaccine status: Up to date  Covid-19 vaccine status: Information provided on how to obtain vaccines.   Qualifies for Shingles Vaccine? Yes   Zostavax completed Yes   Shingrix Completed?: Yes  Screening Tests Health Maintenance  Topic Date Due   COVID-19 Vaccine (7 - Moderna series) 08/30/2022   Fecal DNA (Cologuard)  02/14/2023  Medicare Annual Wellness (AWV)  09/08/2023   MAMMOGRAM  02/08/2024   DEXA SCAN  04/17/2026   Pneumonia Vaccine 49+ Years old  Completed   INFLUENZA VACCINE  Completed   Hepatitis C Screening  Completed   Zoster Vaccines- Shingrix  Completed   HPV VACCINES  Aged Out    Health Maintenance  Health Maintenance Due  Topic Date Due   COVID-19 Vaccine (7 - Moderna series) 08/30/2022    Colorectal cancer screening: Type of screening: Cologuard. Completed 2021. Repeat every 3 years  Mammogram status: Completed 4/23. Repeat  every year  Bone Density status: Completed 04/17/21. Results reflect: Bone density results: NORMAL. Repeat every 5 years.  Lung Cancer Screening: (Low Dose CT Chest recommended if Age 47-80 years, 30 pack-year currently smoking OR have quit w/in 15years.) does not qualify.   Lung Cancer Screening Referral: na  Additional Screening:  Hepatitis C Screening: does qualify; Completed   Vision Screening: Recommended annual ophthalmology exams for early detection of glaucoma and other disorders of the eye. Is the patient up to date with their annual eye exam?  Yes  Who is the provider or what is the name of the office in which the patient attends annual eye exams? [facility] If pt is not established with a provider, would they like to be referred to a provider to establish care? No .   Dental Screening: Recommended annual dental exams for proper oral hygiene  Community Resource Referral / Chronic Care Management: CRR required this visit?  No   CCM required this visit?  No      Plan:     I have personally reviewed and noted the following in the patient's chart:   Medical and social history Use of alcohol, tobacco or illicit drugs  Current medications and supplements including opioid prescriptions. Patient is not currently taking opioid prescriptions. Functional ability and status Nutritional status Physical activity Advanced directives List of other physicians Hospitalizations, surgeries, and ER visits in previous 12 months Vitals Screenings to include cognitive, depression, and falls Referrals and appointments  In addition, I have reviewed and discussed with patient certain preventive protocols, quality metrics, and best practice recommendations. A written personalized care plan for preventive services as well as general preventive health recommendations were provided to patient.     Sharon Seller, NP   09/07/2022    Virtual Visit via Telephone Note  I connected with  patient 09/07/22 at  3:00 PM EST by telephone and verified that I am speaking with the correct person using two identifiers.  Location: Patient: home Provider: [facility]   I discussed the limitations, risks, security and privacy concerns of performing an evaluation and management service by telephone and the availability of in person appointments. I also discussed with the patient that there may be a patient responsible charge related to this service. The patient expressed understanding and agreed to proceed.   I discussed the assessment and treatment plan with the patient. The patient was provided an opportunity to ask questions and all were answered. The patient agreed with the plan and demonstrated an understanding of the instructions.   The patient was advised to call back or seek an in-person evaluation if the symptoms worsen or if the condition fails to improve as anticipated.  I provided 14 minutes of non-face-to-face time during this encounter.  Janene Harvey. [employer] printed and mailed

## 2022-09-07 NOTE — Progress Notes (Signed)
   This service is provided via telemedicine  No vital signs collected/recorded due to the encounter was a telemedicine visit.   Location of patient (ex: home, work):  Home  Patient consents to a telephone visit: Yes, see telephone visit dated   Location of the provider (ex: office, home):  [facility], Office   Name of any referring provider:  N/A  Names of all persons participating in the telemedicine service and their role in the encounter:  S.Kaamil Morefield/CMA, Jessica Eubanks, NP, and Patient   Time spent on call:  9 min with medical assistant   

## 2022-09-07 NOTE — Patient Instructions (Signed)
[name] , Thank you for taking time to come for your Medicare Wellness Visit. I appreciate your ongoing commitment to your health goals. Please review the following plan we discussed and let me know if I can assist you in the future.   Screening recommendations/referrals: Colonoscopy up to date Mammogram up to date Bone Density up to date Recommended yearly ophthalmology/optometry visit for glaucoma screening and checkup Recommended yearly dental visit for hygiene and checkup  Vaccinations: Influenza vaccine- due annually in September/October Pneumococcal vaccine up to date Tdap vaccine up to date Shingles vaccine up to date    Advanced directives: recommended to complete and place on file.   Conditions/risks identified: advance age, obesity, hypertension, hyperlipidemia.   Next appointment: yearly- next in person.    Preventive Care 50 Years and Older, Female Preventive care refers to lifestyle choices and visits with your health care provider that can promote health and wellness. What does preventive care include? A yearly physical exam. This is also called an annual well check. Dental exams once or twice a year. Routine eye exams. Ask your health care provider how often you should have your eyes checked. Personal lifestyle choices, including: Daily care of your teeth and gums. Regular physical activity. Eating a healthy diet. Avoiding tobacco and drug use. Limiting alcohol use. Practicing safe sex. Taking low-dose aspirin every day. Taking vitamin and mineral supplements as recommended by your health care provider. What happens during an annual well check? The services and screenings done by your health care provider during your annual well check will depend on your age, overall health, lifestyle risk factors, and family history of disease. Counseling  Your health care provider may ask you questions about your: Alcohol use. Tobacco use. Drug use. Emotional  well-being. Home and relationship well-being. Sexual activity. Eating habits. History of falls. Memory and ability to understand (cognition). Work and work Astronomer. Reproductive health. Screening  You may have the following tests or measurements: Height, weight, and BMI. Blood pressure. Lipid and cholesterol levels. These may be checked every 5 years, or more frequently if you are over 70 years old. Skin check. Lung cancer screening. You may have this screening every year starting at age 54 if you have a 30-pack-year history of smoking and currently smoke or have quit within the past 15 years. Fecal occult blood test (FOBT) of the stool. You may have this test every year starting at age 65. Flexible sigmoidoscopy or colonoscopy. You may have a sigmoidoscopy every 5 years or a colonoscopy every 10 years starting at age 15. Hepatitis C blood test. Hepatitis B blood test. Sexually transmitted disease (STD) testing. Diabetes screening. This is done by checking your blood sugar (glucose) after you have not eaten for a while (fasting). You may have this done every 1-3 years. Bone density scan. This is done to screen for osteoporosis. You may have this done starting at age 84. Mammogram. This may be done every 1-2 years. Talk to your health care provider about how often you should have regular mammograms. Talk with your health care provider about your test results, treatment options, and if necessary, the need for more tests. Vaccines  Your health care provider may recommend certain vaccines, such as: Influenza vaccine. This is recommended every year. Tetanus, diphtheria, and acellular pertussis (Tdap, Td) vaccine. You may need a Td booster every 10 years. Zoster vaccine. You may need this after age 65. Pneumococcal 13-valent conjugate (PCV13) vaccine. One dose is recommended after age 3. Pneumococcal polysaccharide (PPSV23)  vaccine. One dose is recommended after age 4. Talk to your  health care provider about which screenings and vaccines you need and how often you need them. This information is not intended to replace advice given to you by your health care provider. Make sure you discuss any questions you have with your health care provider. Document Released: 11/01/2015 Document Revised: 06/24/2016 Document Reviewed: 08/06/2015 Elsevier Interactive Patient Education  2017 [place] Prevention in the Home Falls can cause injuries. They can happen to people of all ages. There are many things you can do to make your home safe and to help prevent falls. What can I do on the outside of my home? Regularly fix the edges of walkways and driveways and fix any cracks. Remove anything that might make you trip as you walk through a door, such as a raised step or threshold. Trim any bushes or trees on the path to your home. Use bright outdoor lighting. Clear any walking paths of anything that might make someone trip, such as rocks or tools. Regularly check to see if handrails are loose or broken. Make sure that both sides of any steps have handrails. Any raised decks and porches should have guardrails on the edges. Have any leaves, snow, or ice cleared regularly. Use sand or salt on walking paths during winter. Clean up any spills in your garage right away. This includes oil or grease spills. What can I do in the bathroom? Use night lights. Install grab bars by the toilet and in the tub and shower. Do not use towel bars as grab bars. Use non-skid mats or decals in the tub or shower. If you need to sit down in the shower, use a plastic, non-slip stool. Keep the floor dry. Clean up any water that spills on the floor as soon as it happens. Remove soap buildup in the tub or shower regularly. Attach bath mats securely with double-sided non-slip rug tape. Do not have throw rugs and other things on the floor that can make you trip. What can I do in the bedroom? Use night  lights. Make sure that you have a light by your bed that is easy to reach. Do not use any sheets or blankets that are too big for your bed. They should not hang down onto the floor. Have a firm chair that has side arms. You can use this for support while you get dressed. Do not have throw rugs and other things on the floor that can make you trip. What can I do in the kitchen? Clean up any spills right away. Avoid walking on wet floors. Keep items that you use a lot in easy-to-reach places. If you need to reach something above you, use a strong step stool that has a grab bar. Keep electrical cords out of the way. Do not use floor polish or wax that makes floors slippery. If you must use wax, use non-skid floor wax. Do not have throw rugs and other things on the floor that can make you trip. What can I do with my stairs? Do not leave any items on the stairs. Make sure that there are handrails on both sides of the stairs and use them. Fix handrails that are broken or loose. Make sure that handrails are as long as the stairways. Check any carpeting to make sure that it is firmly attached to the stairs. Fix any carpet that is loose or worn. Avoid having throw rugs at the top or bottom  of the stairs. If you do have throw rugs, attach them to the floor with carpet tape. Make sure that you have a light switch at the top of the stairs and the bottom of the stairs. If you do not have them, ask someone to add them for you. What else can I do to help prevent falls? Wear shoes that: Do not have high heels. Have rubber bottoms. Are comfortable and fit you well. Are closed at the toe. Do not wear sandals. If you use a stepladder: Make sure that it is fully opened. Do not climb a closed stepladder. Make sure that both sides of the stepladder are locked into place. Ask someone to hold it for you, if possible. Clearly mark and make sure that you can see: Any grab bars or handrails. First and last  steps. Where the edge of each step is. Use tools that help you move around (mobility aids) if they are needed. These include: Canes. Walkers. Scooters. Crutches. Turn on the lights when you go into a dark area. Replace any light bulbs as soon as they burn out. Set up your furniture so you have a clear path. Avoid moving your furniture around. If any of your floors are uneven, fix them. If there are any pets around you, be aware of where they are. Review your medicines with your doctor. Some medicines can make you feel dizzy. This can increase your chance of falling. Ask your doctor what other things that you can do to help prevent falls. This information is not intended to replace advice given to you by your health care provider. Make sure you discuss any questions you have with your health care provider. Document Released: 08/01/2009 Document Revised: 03/12/2016 Document Reviewed: 11/09/2014 Elsevier Interactive Patient Education  2017 [employer].

## 2022-09-07 NOTE — Telephone Encounter (Signed)
   This service is provided via telemedicine  No vital signs collected/recorded due to the encounter was a telemedicine visit.   Location of patient (ex: home, work):  Home  Patient consents to a telephone visit: Yes, see telephone visit dated/ /  Location of the provider (ex: office, home):  [facility], Office   Name of any referring provider:  N/A  Names of all persons participating in the telemedicine service and their role in the encounter:  S.Chrae B/CMA, Jessica Eubanks, NP, and Patient   Time spent on call:  9 min with medical assistant   

## 2022-09-08 ENCOUNTER — Ambulatory Visit: Payer: Medicare PPO | Admitting: Nurse Practitioner

## 2022-10-07 ENCOUNTER — Ambulatory Visit: Payer: Medicare PPO | Admitting: Nurse Practitioner

## 2022-10-07 ENCOUNTER — Encounter: Payer: Self-pay | Admitting: Nurse Practitioner

## 2022-10-07 VITALS — BP 126/72 | HR 86 | Temp 97.1°F | Ht 70.0 in | Wt 330.0 lb

## 2022-10-07 DIAGNOSIS — M1A0410 Idiopathic chronic gout, right hand, without tophus (tophi): Secondary | ICD-10-CM

## 2022-10-07 DIAGNOSIS — I10 Essential (primary) hypertension: Secondary | ICD-10-CM

## 2022-10-07 DIAGNOSIS — J45991 Cough variant asthma: Secondary | ICD-10-CM | POA: Diagnosis not present

## 2022-10-07 DIAGNOSIS — M1711 Unilateral primary osteoarthritis, right knee: Secondary | ICD-10-CM

## 2022-10-07 DIAGNOSIS — E785 Hyperlipidemia, unspecified: Secondary | ICD-10-CM

## 2022-10-07 DIAGNOSIS — E559 Vitamin D deficiency, unspecified: Secondary | ICD-10-CM

## 2022-10-07 DIAGNOSIS — R739 Hyperglycemia, unspecified: Secondary | ICD-10-CM

## 2022-10-07 NOTE — Progress Notes (Signed)
Careteam: Patient Care Team: Traci Seller, NP as PCP - General (Geriatric Medicine)  PLACE OF SERVICE:  [facility]  Advanced Directive information Does Patient Have a Medical Advance Directive?: No, Would patient like information on creating a medical advance directive?: No - Patient declined  Allergies  Allergen Reactions   Bactrim [Sulfamethoxazole-Trimethoprim]    Keflex [Cephalexin]    Omnicef [Cefdinir]    Sulfa Antibiotics     Chief Complaint  Patient presents with   Medical Management of Chronic Issues    6 month follow-up. Examine right knee, felt funny last week.      HPI: Patient is a 66 y.o. female for routine follow up.   Reports she stepped funny at church and felt off. Wasn't painful or sore. Able to walk normal now Without complaints today, doing well.   Cough/asthma- stable on symbicort and singulair. Reports some trouble getting symbicort covered.  GERD- stable on protonix and pepcid  Htn- well controlled on norvasc, toprol, and benicar.   Review of Systems:  Review of Systems  Constitutional:  Negative for chills, fever and weight loss.  HENT:  Negative for tinnitus.   Respiratory:  Negative for cough, sputum production and shortness of breath.   Cardiovascular:  Negative for chest pain, palpitations and leg swelling.  Gastrointestinal:  Negative for abdominal pain, constipation, diarrhea and heartburn.  Genitourinary:  Negative for dysuria, frequency and urgency.  Musculoskeletal:  Negative for back pain, falls, joint pain and myalgias.  Skin: Negative.   Neurological:  Negative for dizziness and headaches.  Psychiatric/Behavioral:  Negative for depression and memory loss. The patient does not have insomnia.     Past Medical History:  Diagnosis Date   Allergic rhinitis, unspecified    Arthritis    Per [facility] new patient packet    Asthma    Per [facility] new patient packet    Gout, unspecified    Hyperglycemia    Per labs completed on  03/15/2019 @ [facility]   Hyperlipidemia    Hypertension    Irregular bowel habits    Per [facility] new patient packet    Right knee injury    Stretched veins in right knee    Upper respiratory infection    Varicose vein of leg    Right, Per [facility] new patient packet    Vitamin D deficiency    Past Surgical History:  Procedure Laterality Date   TUBAL LIGATION  1990   [facility]    Social History:   reports that she has never smoked. She has never used smokeless tobacco. She reports that she does not currently use alcohol. She reports that she does not currently use drugs.  Family History  Problem Relation Age of Onset   High blood pressure Mother    Hypertension Mother    Heart disease Father    COPD Father    Heart disease Brother     Medications: Patient's Medications  New Prescriptions   No medications on file  Previous Medications   ACETAMINOPHEN (TYLENOL) 500 MG TABLET    Take 500 mg by mouth as needed.   ALBUTEROL (VENTOLIN HFA) 108 (90 BASE) MCG/ACT INHALER    INHALE 2 PUFFS BY MOUTH EVERY 6 HOURS AS NEEDED FOR WHEEZING OR SHORTNESS OF BREATH   ALBUTEROL SULFATE 2.5 MG/0.5ML NEBU    Inhale 0.5 mLs (2.5 mg total) into the lungs as needed.   ALLOPURINOL (ZYLOPRIM) 100 MG TABLET    TAKE 1 TABLET(100 MG)  BY MOUTH DAILY   AMLODIPINE (NORVASC) 5 MG TABLET    TAKE 1 TABLET(5 MG) BY MOUTH DAILY   AZELASTINE (OPTIVAR) 0.05 % OPHTHALMIC SOLUTION    1 drop 2 (two) times daily as needed.   BUDESONIDE-FORMOTEROL (SYMBICORT) 80-4.5 MCG/ACT INHALER    Inhale 2 puffs into the lungs 2 (two) times daily.   CHOLECALCIFEROL (VITAMIN D3 PO)    Take 1 capsule by mouth daily.   FAMOTIDINE (PEPCID) 20 MG TABLET    TAKE 1 TABLET BY MOUTH AFTER SUPPER   FUROSEMIDE (LASIX) 40 MG TABLET    TAKE 1 AND 1/2 TABLETS BY MOUTH DAILY   METOPROLOL SUCCINATE (TOPROL-XL) 25 MG 24 HR TABLET    TAKE 1 TABLET(25 MG) BY MOUTH DAILY   MONTELUKAST (SINGULAIR) 10 MG TABLET    TAKE 1 TABLET(10 MG) BY MOUTH AT  BEDTIME   NYSTATIN (MYCOSTATIN/NYSTOP) POWDER    Apply topically as needed.   NYSTATIN CREAM (MYCOSTATIN)    APPLY TOPICALLY TO THE AFFECTED AREA TWICE DAILY AS NEEDED FOR DRY SKIN   OLMESARTAN (BENICAR) 20 MG TABLET    TAKE 1 TABLET(20 MG) BY MOUTH DAILY   PANTOPRAZOLE (PROTONIX) 40 MG TABLET    TAKE 1 TABLET(40 MG) BY MOUTH DAILY 30 TO 60 MINUTES BEFORE FIRST MEAL OF THE DAY   POTASSIUM CHLORIDE (MICRO-K) 10 MEQ CR CAPSULE    TAKE 1 CAPSULE(10 MEQ) BY MOUTH DAILY  Modified Medications   No medications on file  Discontinued Medications   CLINDAMYCIN (CLEOCIN) 300 MG CAPSULE    Take 300 mg by mouth every 6 (six) hours.    Physical Exam:  Vitals:   10/07/22 0911  BP: 126/72  Pulse: 86  Temp: (!) 97.1 F (36.2 C)  TempSrc: Temporal  SpO2: 97%  Weight: (!) 330 lb (149.7 kg)  Height: 5\' 10"  (1.778 m)   Body mass index is 47.35 kg/m. Wt Readings from Last 3 Encounters:  10/07/22 (!) 330 lb (149.7 kg)  09/07/22 (!) 335 lb (152 kg)  04/06/22 (!) 327 lb 9.6 oz (148.6 kg)    Physical Exam Constitutional:      General: She is not in acute distress.    Appearance: She is well-developed. She is not diaphoretic.  HENT:     Head: Normocephalic and atraumatic.     Mouth/Throat:     Pharynx: No oropharyngeal exudate.  Eyes:     Conjunctiva/sclera: Conjunctivae normal.     Pupils: Pupils are equal, round, and reactive to light.  Cardiovascular:     Rate and Rhythm: Normal rate and regular rhythm.     Heart sounds: Normal heart sounds.  Pulmonary:     Effort: Pulmonary effort is normal.     Breath sounds: Normal breath sounds.  Abdominal:     General: Bowel sounds are normal.     Palpations: Abdomen is soft.  Musculoskeletal:     Cervical back: Normal range of motion and neck supple.     Right knee: Crepitus present.     Left knee: Crepitus present.     Right lower leg: No edema.     Left lower leg: No edema.  Skin:    General: Skin is warm and dry.  Neurological:      Mental Status: She is alert and oriented to person, place, and time.  Psychiatric:        Mood and Affect: Mood normal.     Labs reviewed: Basic Metabolic Panel: Recent Labs    04/06/22 0845  NA 142  K 4.3  CL 107  CO2 28  GLUCOSE 99  BUN 21  CREATININE 1.16*  CALCIUM 9.1   Liver Function Tests: Recent Labs    04/06/22 0845  AST 15  ALT 11  BILITOT 0.5  PROT 6.6   No results for input(s): "LIPASE", "AMYLASE" in the last 8760 hours. No results for input(s): "AMMONIA" in the last 8760 hours. CBC: Recent Labs    04/06/22 0845  WBC 3.3*  NEUTROABS 1,099*  HGB 13.8  HCT 42.7  MCV 87.1  PLT 233   Lipid Panel: Recent Labs    04/06/22 0845  CHOL 151  HDL 62  LDLCALC 74  TRIG 69  CHOLHDL 2.4   TSH: No results for input(s): "TSH" in the last 8760 hours. A1C: Lab Results  Component Value Date   HGBA1C 5.8 (H) 04/06/2022     Assessment/Plan 1. 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 Cough variant asthma -well controlled at this time, will notify if insurance prefers different medication/inhaler  3. Essential hypertension -Blood pressure well controlled, goal bp <140/90 Continue current medications and dietary modifications follow metabolic panel - COMPLETE METABOLIC PANEL WITH GFR - CBC with Differential/Platelet  4. Chronic gout of right hand, unspecified cause Well controlled, no recent flare  5. Hyperlipidemia, unspecified hyperlipidemia type Continues on dietary modifications - Lipid panel  6. Hyperglycemia -dietary modifications encouraged - Hemoglobin A1c  7. Vitamin D deficiency Continues on supplement.   8. Primary osteoarthritis of right knee No pain at this time, encouraged weight loss and strength training.     Return in about 6 months (around 04/08/2023) for routine follow up . Janene Harvey. Biagio Borg  [facility] [phone]

## 2022-10-08 LAB — HEMOGLOBIN A1C
Hgb A1c MFr Bld: 6.1 % of total Hgb — ABNORMAL HIGH (ref <5.7)
Mean Plasma Glucose: 128 mg/dL
eAG (mmol/L): 7.1 mmol/L

## 2022-10-08 LAB — LIPID PANEL
Cholesterol: 160 mg/dL (ref <200)
HDL: 64 mg/dL (ref >=50)
LDL Cholesterol (Calc): 81 mg/dL (calc)
Non-HDL Cholesterol (Calc): 96 mg/dL (calc) (ref <130)
Total CHOL/HDL Ratio: 2.5 (calc) (ref <5.0)
Triglycerides: 72 mg/dL (ref <150)

## 2022-10-08 LAB — CBC WITH DIFFERENTIAL/PLATELET
Absolute Monocytes: 393 cells/uL (ref 200–950)
Basophils Absolute: 21 cells/uL (ref 0–200)
Basophils Relative: 0.7 %
Eosinophils Absolute: 60 cells/uL (ref 15–500)
Eosinophils Relative: 2 %
HCT: 42 % (ref 35.0–45.0)
Hemoglobin: 14.1 g/dL (ref 11.7–15.5)
Lymphs Abs: 1692 cells/uL (ref 850–3900)
MCH: 28.3 pg (ref 27.0–33.0)
MCHC: 33.6 g/dL (ref 32.0–36.0)
MCV: 84.3 fL (ref 80.0–100.0)
MPV: 11.2 fL (ref 7.5–12.5)
Monocytes Relative: 13.1 %
Neutro Abs: 834 cells/uL — ABNORMAL LOW (ref 1500–7800)
Neutrophils Relative %: 27.8 %
Platelets: 249 10*3/uL (ref 140–400)
RBC: 4.98 10*6/uL (ref 3.80–5.10)
RDW: 13 % (ref 11.0–15.0)
Total Lymphocyte: 56.4 %
WBC: 3 10*3/uL — ABNORMAL LOW (ref 3.8–10.8)

## 2022-10-08 LAB — COMPLETE METABOLIC PANEL WITH GFR
AG Ratio: 1.4 (calc) (ref 1.0–2.5)
ALT: 16 U/L (ref 6–29)
AST: 21 U/L (ref 10–35)
Albumin: 4.2 g/dL (ref 3.6–5.1)
Alkaline phosphatase (APISO): 72 U/L (ref 37–153)
BUN: 18 mg/dL (ref 7–25)
CO2: 29 mmol/L (ref 20–32)
Calcium: 9.5 mg/dL (ref 8.6–10.4)
Chloride: 105 mmol/L (ref 98–110)
Creat: 1.05 mg/dL (ref 0.50–1.05)
Globulin: 3 g/dL (calc) (ref 1.9–3.7)
Glucose, Bld: 102 mg/dL — ABNORMAL HIGH (ref 65–99)
Potassium: 4.1 mmol/L (ref 3.5–5.3)
Sodium: 143 mmol/L (ref 135–146)
Total Bilirubin: 0.8 mg/dL (ref 0.2–1.2)
Total Protein: 7.2 g/dL (ref 6.1–8.1)
eGFR: 58 mL/min/{1.73_m2} — ABNORMAL LOW (ref >=60)

## 2022-10-09 ENCOUNTER — Ambulatory Visit: Payer: Medicare PPO | Admitting: Nurse Practitioner

## 2022-10-14 ENCOUNTER — Encounter: Payer: Self-pay | Admitting: Nurse Practitioner

## 2022-11-21 ENCOUNTER — Other Ambulatory Visit: Payer: Self-pay | Admitting: Nurse Practitioner

## 2022-11-21 DIAGNOSIS — J45991 Cough variant asthma: Secondary | ICD-10-CM

## 2022-11-23 ENCOUNTER — Encounter: Payer: Self-pay | Admitting: Nurse Practitioner

## 2023-01-05 ENCOUNTER — Other Ambulatory Visit: Payer: Self-pay | Admitting: Nurse Practitioner

## 2023-01-05 DIAGNOSIS — I83893 Varicose veins of bilateral lower extremities with other complications: Secondary | ICD-10-CM

## 2023-01-05 DIAGNOSIS — I10 Essential (primary) hypertension: Secondary | ICD-10-CM

## 2023-01-05 DIAGNOSIS — J45991 Cough variant asthma: Secondary | ICD-10-CM

## 2023-01-05 NOTE — Telephone Encounter (Signed)
[place]. I will send to Lauree Chandler, NP to approve due to out of state prescribing guidelines

## 2023-02-19 LAB — HM MAMMOGRAPHY

## 2023-02-27 ENCOUNTER — Other Ambulatory Visit: Payer: Self-pay | Admitting: Nurse Practitioner

## 2023-02-27 DIAGNOSIS — M1A0410 Idiopathic chronic gout, right hand, without tophus (tophi): Secondary | ICD-10-CM

## 2023-02-27 DIAGNOSIS — I10 Essential (primary) hypertension: Secondary | ICD-10-CM

## 2023-02-27 DIAGNOSIS — I83893 Varicose veins of bilateral lower extremities with other complications: Secondary | ICD-10-CM

## 2023-04-01 ENCOUNTER — Other Ambulatory Visit: Payer: Self-pay | Admitting: Nurse Practitioner

## 2023-04-01 DIAGNOSIS — Z1211 Encounter for screening for malignant neoplasm of colon: Secondary | ICD-10-CM

## 2023-04-01 DIAGNOSIS — I10 Essential (primary) hypertension: Secondary | ICD-10-CM

## 2023-04-01 NOTE — Addendum Note (Signed)
Addended by: Sharon Seller on: 04/01/2023 03:35 PM   Modules accepted: Orders

## 2023-04-01 NOTE — Addendum Note (Signed)
Addended by: Elveria Royals on: 04/01/2023 10:00 AM   Modules accepted: Orders

## 2023-04-02 ENCOUNTER — Other Ambulatory Visit: Payer: Self-pay | Admitting: Nurse Practitioner

## 2023-04-02 DIAGNOSIS — J45991 Cough variant asthma: Secondary | ICD-10-CM

## 2023-04-09 ENCOUNTER — Encounter: Payer: Self-pay | Admitting: Nurse Practitioner

## 2023-04-09 ENCOUNTER — Ambulatory Visit: Payer: Medicare PPO | Admitting: Nurse Practitioner

## 2023-04-09 VITALS — BP 132/80 | HR 68 | Temp 97.5°F | Ht 70.0 in | Wt 339.0 lb

## 2023-04-09 DIAGNOSIS — E559 Vitamin D deficiency, unspecified: Secondary | ICD-10-CM

## 2023-04-09 DIAGNOSIS — M1A0410 Idiopathic chronic gout, right hand, without tophus (tophi): Secondary | ICD-10-CM | POA: Diagnosis not present

## 2023-04-09 DIAGNOSIS — R739 Hyperglycemia, unspecified: Secondary | ICD-10-CM

## 2023-04-09 DIAGNOSIS — J45991 Cough variant asthma: Secondary | ICD-10-CM | POA: Diagnosis not present

## 2023-04-09 DIAGNOSIS — E785 Hyperlipidemia, unspecified: Secondary | ICD-10-CM

## 2023-04-09 DIAGNOSIS — Z1212 Encounter for screening for malignant neoplasm of rectum: Secondary | ICD-10-CM

## 2023-04-09 DIAGNOSIS — Z1211 Encounter for screening for malignant neoplasm of colon: Secondary | ICD-10-CM

## 2023-04-09 DIAGNOSIS — I10 Essential (primary) hypertension: Secondary | ICD-10-CM

## 2023-04-09 DIAGNOSIS — M1711 Unilateral primary osteoarthritis, right knee: Secondary | ICD-10-CM

## 2023-04-09 LAB — COMPLETE METABOLIC PANEL WITH GFR
AG Ratio: 1.4 (calc) (ref 1.0–2.5)
Albumin: 4.2 g/dL (ref 3.6–5.1)
Calcium: 9.4 mg/dL (ref 8.6–10.4)
Chloride: 105 mmol/L (ref 98–110)
Globulin: 3 g/dL (calc) (ref 1.9–3.7)

## 2023-04-09 LAB — CBC WITH DIFFERENTIAL/PLATELET
Absolute Monocytes: 489 cells/uL (ref 200–950)
MCV: 85.5 fL (ref 80.0–100.0)
MPV: 10.9 fL (ref 7.5–12.5)
Monocytes Relative: 10.4 %
RBC: 5.04 10*6/uL (ref 3.80–5.10)

## 2023-04-09 NOTE — Patient Instructions (Signed)
To walk 10 mins after each meal Cut back sweets-example limit yourself to 2 desserts a week.

## 2023-04-09 NOTE — Progress Notes (Unsigned)
Careteam: Patient Care Team: Sharon Seller, NP as PCP - General (Geriatric Medicine)  PLACE OF SERVICE:  [facility]  Advanced Directive information Does Patient Have a Medical Advance Directive?: No, Would patient like information on creating a medical advance directive?: No - Patient declined  Allergies  Allergen Reactions   Bactrim [Sulfamethoxazole-Trimethoprim]    Keflex [Cephalexin]    Omnicef [Cefdinir]    Sulfa Antibiotics     Chief Complaint  Patient presents with   Medical Management of Chronic Issues    6 month follow-up. Discuss need for cologuard      HPI: Patient is a 67 y.o. female for routine follow up.   GERD- worse in the morning. On protonix and on pepcid  Has gained weight- likes sweets.   No recent gout flares.   Asthma- heat makes it worse but overall controlled. Continues on Symbicort twice daily which has been more effective.    Review of Systems:  Review of Systems  Constitutional:  Negative for chills, fever and weight loss.  HENT:  Negative for tinnitus.   Respiratory:  Negative for cough, sputum production and shortness of breath.   Cardiovascular:  Negative for chest pain, palpitations and leg swelling.  Gastrointestinal:  Positive for heartburn. Negative for abdominal pain, constipation and diarrhea.  Genitourinary:  Negative for dysuria, frequency and urgency.  Musculoskeletal:  Negative for back pain, falls, joint pain and myalgias.  Skin: Negative.   Neurological:  Negative for dizziness and headaches.  Psychiatric/Behavioral:  Negative for depression and memory loss. The patient does not have insomnia.     Past Medical History:  Diagnosis Date   Allergic rhinitis, unspecified    Arthritis    Per [facility] new patient packet    Asthma    Per [facility] new patient packet    Gout, unspecified    Hyperglycemia    Per labs completed on 03/15/2019 @ [facility]   Hyperlipidemia    Hypertension    Irregular bowel habits    Per [facility] new  patient packet    Right knee injury    Stretched veins in right knee    Upper respiratory infection    Varicose vein of leg    Right, Per [facility] new patient packet    Vitamin D deficiency    Past Surgical History:  Procedure Laterality Date   TUBAL LIGATION  1990   [facility]    Social History:   reports that she has never smoked. She has never used smokeless tobacco. She reports that she does not currently use alcohol. She reports that she does not currently use drugs.  Family History  Problem Relation Age of Onset   High blood pressure Mother    Hypertension Mother    Heart disease Father    COPD Father    Heart disease Brother     Medications: Patient's Medications  New Prescriptions   No medications on file  Previous Medications   ACETAMINOPHEN (TYLENOL) 500 MG TABLET    Take 500 mg by mouth as needed.   ALBUTEROL (VENTOLIN HFA) 108 (90 BASE) MCG/ACT INHALER    INHALE 2 PUFFS BY MOUTH EVERY 6 HOURS AS NEEDED FOR WHEEZING OR SHORTNESS OF BREATH   ALBUTEROL SULFATE 2.5 MG/0.5ML NEBU    Inhale 0.5 mLs (2.5 mg total) into the lungs as needed.   ALLOPURINOL (ZYLOPRIM) 100 MG TABLET    TAKE 1 TABLET(100 MG) BY MOUTH DAILY   AMLODIPINE (NORVASC) 5 MG TABLET  TAKE 1 TABLET(5 MG) BY MOUTH DAILY   CHOLECALCIFEROL (VITAMIN D3 PO)    Take 1 capsule by mouth daily.   FAMOTIDINE (PEPCID) 20 MG TABLET    TAKE 1 TABLET BY MOUTH AFTER SUPPER   FUROSEMIDE (LASIX) 40 MG TABLET    TAKE 1 AND 1/2 TABLETS BY MOUTH DAILY   METOPROLOL SUCCINATE (TOPROL-XL) 25 MG 24 HR TABLET    TAKE 1 TABLET(25 MG) BY MOUTH DAILY   MONTELUKAST (SINGULAIR) 10 MG TABLET    TAKE 1 TABLET(10 MG) BY MOUTH AT BEDTIME   NYSTATIN (MYCOSTATIN/NYSTOP) POWDER    Apply topically as needed.   NYSTATIN CREAM (MYCOSTATIN)    APPLY TOPICALLY TO THE AFFECTED AREA TWICE DAILY AS NEEDED FOR DRY SKIN   OLMESARTAN (BENICAR) 20 MG TABLET    TAKE 1 TABLET(20 MG) BY MOUTH DAILY   PANTOPRAZOLE (PROTONIX) 40 MG TABLET     TAKE 1 TABLET(40 MG) BY MOUTH DAILY 30 TO 60 MINUTES BEFORE FIRST MEAL OF THE DAY   POTASSIUM CHLORIDE (MICRO-K) 10 MEQ CR CAPSULE    TAKE 1 CAPSULE(10 MEQ) BY MOUTH DAILY   SYMBICORT 80-4.5 MCG/ACT INHALER    INHALE 2 PUFFS INTO THE LUNGS TWICE DAILY  Modified Medications   No medications on file  Discontinued Medications   AZELASTINE (OPTIVAR) 0.05 % OPHTHALMIC SOLUTION    1 drop 2 (two) times daily as needed.    Physical Exam:  Vitals:   04/09/23 1512  BP: 132/80  Pulse: 68  Temp: (!) 97.5 F (36.4 C)  TempSrc: Temporal  SpO2: 98%  Weight: (!) 339 lb (153.8 kg)  Height: 5\' 10"  (1.778 m)   Body mass index is 48.64 kg/m. Wt Readings from Last 3 Encounters:  04/09/23 (!) 339 lb (153.8 kg)  10/07/22 (!) 330 lb (149.7 kg)  09/07/22 (!) 335 lb (152 kg)    Physical Exam Constitutional:      General: She is not in acute distress.    Appearance: She is well-developed. She is not diaphoretic.  HENT:     Head: Normocephalic and atraumatic.     Mouth/Throat:     Pharynx: No oropharyngeal exudate.  Eyes:     Conjunctiva/sclera: Conjunctivae normal.     Pupils: Pupils are equal, round, and reactive to light.  Cardiovascular:     Rate and Rhythm: Normal rate and regular rhythm.     Heart sounds: Normal heart sounds.  Pulmonary:     Effort: Pulmonary effort is normal.     Breath sounds: Normal breath sounds.  Abdominal:     General: Bowel sounds are normal.     Palpations: Abdomen is soft.  Musculoskeletal:     Cervical back: Normal range of motion and neck supple.     Right lower leg: No edema.     Left lower leg: No edema.  Skin:    General: Skin is warm and dry.  Neurological:     Mental Status: She is alert.  Psychiatric:        Mood and Affect: Mood normal.   ***  Labs reviewed: Basic Metabolic Panel: Recent Labs    10/07/22 0000  NA 143  K 4.1  CL 105  CO2 29  GLUCOSE 102*  BUN 18  CREATININE 1.05  CALCIUM 9.5   Liver Function Tests: Recent Labs     10/07/22 0000  AST 21  ALT 16  BILITOT 0.8  PROT 7.2   No results for input(s): "LIPASE", "AMYLASE" in the last 8760  hours. No results for input(s): "AMMONIA" in the last 8760 hours. CBC: Recent Labs    10/07/22 0000  WBC 3.0*  NEUTROABS 834*  HGB 14.1  HCT 42.0  MCV 84.3  PLT 249   Lipid Panel: Recent Labs    10/07/22 0000  CHOL 160  HDL 64  LDLCALC 81  TRIG 72  CHOLHDL 2.5   TSH: No results for input(s): "TSH" in the last 8760 hours. A1C: Lab Results  Component Value Date   HGBA1C 6.1 (H) 10/07/2022     Assessment/Plan 1. Cough variant asthma ***  2. Essential hypertension *** - COMPLETE METABOLIC PANEL WITH GFR - CBC with Differential/Platelet  3. Chronic gout of right hand, unspecified cause *** - Uric Acid  4. Hyperlipidemia, unspecified hyperlipidemia type ***  5. Hyperglycemia -dietary modifications.  - COMPLETE METABOLIC PANEL WITH GFR - Hemoglobin A1c  6. Obesity, morbid (more than 100 lbs over ideal weight or BMI > 40) (HCC) -education provided on healthy weight loss through increase in physical activity and proper nutrition   7. Primary osteoarthritis of right knee Overall stable, continues to have occasionally discomfort, will use tylenol PRN.   8. Vitamin D deficiency Continues on supplement.   9. Encounter for colorectal cancer screening - Cologuard   No follow-ups on file.: ***  Azyriah Nevins K. Biagio Borg [facility] [phone]

## 2023-04-10 LAB — COMPLETE METABOLIC PANEL WITH GFR
ALT: 13 U/L (ref 6–29)
AST: 15 U/L (ref 10–35)
Alkaline phosphatase (APISO): 77 U/L (ref 37–153)
BUN/Creatinine Ratio: 20 (calc) (ref 6–22)
BUN: 21 mg/dL (ref 7–25)
CO2: 32 mmol/L (ref 20–32)
Creat: 1.06 mg/dL — ABNORMAL HIGH (ref 0.50–1.05)
Glucose, Bld: 113 mg/dL — ABNORMAL HIGH (ref 65–99)
Potassium: 4.7 mmol/L (ref 3.5–5.3)
Sodium: 143 mmol/L (ref 135–146)
Total Bilirubin: 0.7 mg/dL (ref 0.2–1.2)
Total Protein: 7.2 g/dL (ref 6.1–8.1)
eGFR: 58 mL/min/{1.73_m2} — ABNORMAL LOW (ref >=60)

## 2023-04-10 LAB — CBC WITH DIFFERENTIAL/PLATELET
Basophils Absolute: 28 cells/uL (ref 0–200)
Basophils Relative: 0.6 %
Eosinophils Absolute: 99 cells/uL (ref 15–500)
Eosinophils Relative: 2.1 %
HCT: 43.1 % (ref 35.0–45.0)
Hemoglobin: 13.9 g/dL (ref 11.7–15.5)
Lymphs Abs: 2421 cells/uL (ref 850–3900)
MCH: 27.6 pg (ref 27.0–33.0)
MCHC: 32.3 g/dL (ref 32.0–36.0)
Neutro Abs: 1664 cells/uL (ref 1500–7800)
Neutrophils Relative %: 35.4 %
Platelets: 260 10*3/uL (ref 140–400)
RDW: 12.9 % (ref 11.0–15.0)
Total Lymphocyte: 51.5 %
WBC: 4.7 10*3/uL (ref 3.8–10.8)

## 2023-04-10 LAB — HEMOGLOBIN A1C
Hgb A1c MFr Bld: 6.1 % of total Hgb — ABNORMAL HIGH (ref <5.7)
Mean Plasma Glucose: 128 mg/dL
eAG (mmol/L): 7.1 mmol/L

## 2023-04-10 LAB — URIC ACID: Uric Acid, Serum: 5.3 mg/dL (ref 2.5–7.0)

## 2023-04-25 ENCOUNTER — Encounter (HOSPITAL_COMMUNITY): Payer: Self-pay | Admitting: Emergency Medicine

## 2023-04-25 ENCOUNTER — Emergency Department (HOSPITAL_COMMUNITY): Payer: Medicare PPO

## 2023-04-25 ENCOUNTER — Emergency Department (HOSPITAL_COMMUNITY)
Admission: EM | Admit: 2023-04-25 | Discharge: 2023-04-25 | Disposition: A | Discharge status: Home / Self Care | Payer: Medicare PPO | Attending: Emergency Medicine | Admitting: Emergency Medicine

## 2023-04-25 ENCOUNTER — Other Ambulatory Visit: Payer: Self-pay

## 2023-04-25 DIAGNOSIS — W01198A Fall on same level from slipping, tripping and stumbling with subsequent striking against other object, initial encounter: Secondary | ICD-10-CM | POA: Diagnosis not present

## 2023-04-25 DIAGNOSIS — M25561 Pain in right knee: Secondary | ICD-10-CM | POA: Diagnosis present

## 2023-04-25 DIAGNOSIS — Y9301 Activity, walking, marching and hiking: Secondary | ICD-10-CM | POA: Diagnosis not present

## 2023-04-25 MED ORDER — IBUPROFEN 400 MG PO TABS
600.0000 mg | ORAL_TABLET | Freq: Once | ORAL | Status: DC
  Filled 2023-04-25: qty 2

## 2023-04-25 NOTE — ED Provider Notes (Signed)
[place] EMERGENCY DEPARTMENT AT [facility] Provider Note   CSN: [id number] Arrival date & time: 04/25/23  1844     History  Chief Complaint  Patient presents with   Traci Gomez    Traci Gomez is a 67 y.o. female.  The ER for right knee pain after fall today.  She was walking outside and tripped over a small tree stump.  Fell on her right knee.  No head injury, no LOC, she is not on blood thinners.  She states family member was with her who is a Engineer, civil (consulting) and told her not to get up and called 911.  She has no neck or back pain, no other injuries.   Fall       Home Medications Prior to Admission medications   Medication Sig Start Date End Date Taking? Authorizing Provider  acetaminophen (TYLENOL) 500 MG tablet Take 500 mg by mouth as needed.    [provider]  albuterol (VENTOLIN HFA) 108 (90 Base) MCG/ACT inhaler INHALE 2 PUFFS BY MOUTH EVERY 6 HOURS AS NEEDED FOR WHEEZING OR SHORTNESS OF BREATH 06/11/22   Sharon Seller, NP  Albuterol Sulfate 2.5 MG/0.5ML NEBU Inhale 0.5 mLs (2.5 mg total) into the lungs as needed. 04/16/20   Sharon Seller, NP  allopurinol (ZYLOPRIM) 100 MG tablet TAKE 1 TABLET(100 MG) BY MOUTH DAILY 03/01/23   Sharon Seller, NP  amLODipine (NORVASC) 5 MG tablet TAKE 1 TABLET(5 MG) BY MOUTH DAILY 04/01/23   Sharon Seller, NP  Cholecalciferol (VITAMIN D3 PO) Take 1 capsule by mouth daily.    [provider]  famotidine (PEPCID) 20 MG tablet TAKE 1 TABLET BY MOUTH AFTER SUPPER 11/23/22   Sharon Seller, NP  furosemide (LASIX) 40 MG tablet TAKE 1 AND 1/2 TABLETS BY MOUTH DAILY 01/05/23   Sharon Seller, NP  metoprolol succinate (TOPROL-XL) 25 MG 24 hr tablet TAKE 1 TABLET(25 MG) BY MOUTH DAILY 01/05/23   Sharon Seller, NP  montelukast (SINGULAIR) 10 MG tablet TAKE 1 TABLET(10 MG) BY MOUTH AT BEDTIME 06/01/22   Sharon Seller, NP  nystatin (MYCOSTATIN/NYSTOP) powder Apply topically as needed.    [provider]  nystatin cream (MYCOSTATIN) APPLY TOPICALLY TO THE AFFECTED AREA TWICE DAILY AS NEEDED FOR DRY SKIN 04/08/21   Sharon Seller, NP  olmesartan (BENICAR) 20 MG tablet TAKE 1 TABLET(20 MG) BY MOUTH DAILY 01/05/23   Sharon Seller, NP  pantoprazole (PROTONIX) 40 MG tablet TAKE 1 TABLET(40 MG) BY MOUTH DAILY 30 TO 60 MINUTES BEFORE FIRST MEAL OF THE DAY 04/02/23   Sharon Seller, NP  potassium chloride (MICRO-K) 10 MEQ CR capsule TAKE 1 CAPSULE(10 MEQ) BY MOUTH DAILY 03/01/23   Sharon Seller, NP  SYMBICORT 80-4.5 MCG/ACT inhaler INHALE 2 PUFFS INTO THE LUNGS TWICE DAILY 01/05/23   Sharon Seller, NP      Allergies    Bactrim [sulfamethoxazole-trimethoprim], Keflex [cephalexin], Nsaids, Omnicef [cefdinir], and Sulfa antibiotics    Review of Systems   Review of Systems  Physical Exam Updated Vital Signs BP (!) 169/100 (BP Location: Left Arm)   Pulse 95   Temp 98.5 F (36.9 C) (Oral)   Resp 16   SpO2 95%  Physical Exam Vitals and nursing note reviewed.  Constitutional:      General: She is not in acute distress.    Appearance: She is well-developed.  HENT:     Head: Normocephalic and atraumatic.     Mouth/Throat:  Mouth: Mucous membranes are moist.  Eyes:     Conjunctiva/sclera: Conjunctivae normal.  Cardiovascular:     Rate and Rhythm: Normal rate and regular rhythm.     Heart sounds: No murmur heard. Pulmonary:     Effort: Pulmonary effort is normal. No respiratory distress.     Breath sounds: Normal breath sounds.  Abdominal:     Palpations: Abdomen is soft.     Tenderness: There is no abdominal tenderness.  Musculoskeletal:        General: No swelling.     Cervical back: Neck supple.     Comments: Denies tenderness to right fibular head and mild tenderness to patella.  No crepitus, no swelling.  No bruising at this time.  DP and PT pulses are intact in the right foot.  Patient can fully flex extend the right knee, normal strength, able to bear some  weight on the right leg.  No tenderness to the hip or ankle  Skin:    General: Skin is warm and dry.     Capillary Refill: Capillary refill takes less than 2 seconds.  Neurological:     General: No focal deficit present.     Mental Status: She is alert and oriented to person, place, and time.  Psychiatric:        Mood and Affect: Mood normal.     ED Results / Procedures / Treatments   Labs (all labs ordered are listed, but only abnormal results are displayed) Labs Reviewed - No data to display  EKG None  Radiology DG Knee Complete 4 Views Right  Result Date: 04/25/2023 CLINICAL DATA:  Fall earlier today. EXAM: RIGHT KNEE - COMPLETE 4+ VIEW COMPARISON:  None Available. FINDINGS: No evidence of fracture, dislocation, or joint effusion. Mild degenerative change with medial tibiofemoral and patellofemoral spurring. No erosions. Scattered phleboliths in the soft tissues. IMPRESSION: 1. No fracture or subluxation of the right knee. 2. Mild osteoarthritis. Electronically Signed   By: Narda Rutherford M.D.   On: 04/25/2023 19:29    Procedures Procedures    Medications Ordered in ED Medications  ibuprofen (ADVIL) tablet 600 mg (600 mg Oral Patient Refused/Not Given 04/25/23 1945)    ED Course/ Medical Decision Making/ A&P                             Medical Decision Making DDx: Fracture, sprain, strain, dislocation, other  ED course: Patient presents for right lateral knee pain after a fall.  Presents via EMS in a soft splint, she has normal range of motion, normal pulses and has only point tenderness over the fibular head.  No fracture or dislocation on x-ray.  No other injuries, no head injury or loss of consciousness, she is not on blood thinners.  Discussed supportive care with Ace wrap, OTC medications and crutches as needed to help with weightbearing.  He is advised she can weight-bear as tolerated, follow-up with her PCP if not improving she can follow-up with  orthopedics  Amount and/or Complexity of Data Reviewed External Data Reviewed: notes. Radiology: ordered and independent interpretation performed.    Details: X-ray right knee shows no fracture, no dislocation I agree with radiology read  Risk Prescription drug management.           Final Clinical Impression(s) / ED Diagnoses Final diagnoses:  Acute pain of right knee    Rx / DC Orders ED Discharge Orders     None  Josem Kaufmann 04/25/23 1948    Eber Hong, MD 04/25/23 2059

## 2023-04-25 NOTE — Discharge Instructions (Signed)
It was a pleasure taking care of you today.  You were seen for pain in the right knee after a fall.  Your x-rays did not show any fractures or dislocation, you can use Tylenol or Motrin over-the-counter as needed for pain and use the crutches.  We also applied an Ace wrap to help support your knee.  Follow-up close with your primary care doctor or orthopedics if you are not getting better.  Return to the ER if you have any new or worsening symptoms.

## 2023-04-25 NOTE — ED Triage Notes (Signed)
Pt BIB [facility] with reports of right knee pain after a fall. Pt reports tripping over a "knob" sticking out of the ground.

## 2023-04-28 ENCOUNTER — Telehealth: Payer: Self-pay

## 2023-04-28 NOTE — Transitions of Care (Post Inpatient/ED Visit) (Signed)
   04/28/2023  Name: Traci Gomez MRN: [id number] DOB: 1956/09/17  Today's TOC FU Call Status:    Attempted to reach the patient regarding the most recent Inpatient/ED visit.  Follow Up Plan: Additional outreach attempts will be made to reach the patient to complete the Transitions of Care (Post Inpatient/ED visit) call.   Signature : Guss Bunde [facility]

## 2023-04-30 ENCOUNTER — Telehealth: Payer: Medicare PPO

## 2023-04-30 NOTE — Transitions of Care (Post Inpatient/ED Visit) (Signed)
   04/30/2023  Name: Traci Gomez MRN: [id number] DOB: 29-Oct-1955  Today's TOC FU Call Status:    Attempted to reach the patient regarding the most recent Inpatient/ED visit.  Follow Up Plan: Additional outreach attempts will be made to reach the patient to complete the Transitions of Care (Post Inpatient/ED visit) call.   Signature : Guss Bunde [facility]

## 2023-05-03 ENCOUNTER — Encounter: Payer: Self-pay | Admitting: Nurse Practitioner

## 2023-05-05 ENCOUNTER — Encounter: Payer: Self-pay | Admitting: Adult Health

## 2023-05-05 ENCOUNTER — Ambulatory Visit (INDEPENDENT_AMBULATORY_CARE_PROVIDER_SITE_OTHER): Payer: Medicare PPO | Admitting: Adult Health

## 2023-05-05 VITALS — BP 127/88 | HR 76 | Temp 98.3°F | Resp 18 | Ht 70.0 in | Wt 341.6 lb

## 2023-05-05 DIAGNOSIS — Z9181 History of falling: Secondary | ICD-10-CM | POA: Diagnosis not present

## 2023-05-05 DIAGNOSIS — R600 Localized edema: Secondary | ICD-10-CM | POA: Diagnosis not present

## 2023-05-05 DIAGNOSIS — M1A0410 Idiopathic chronic gout, right hand, without tophus (tophi): Secondary | ICD-10-CM

## 2023-05-05 DIAGNOSIS — I10 Essential (primary) hypertension: Secondary | ICD-10-CM | POA: Diagnosis not present

## 2023-05-05 LAB — COLOGUARD: COLOGUARD: NEGATIVE

## 2023-05-05 NOTE — Addendum Note (Signed)
Addended by: Kenard Gower C on: 05/05/2023 10:34 AM   Modules accepted: Level of Service

## 2023-05-05 NOTE — Progress Notes (Signed)
[facility]  Provider:  Kenard Gower DNP  Code Status:  Full Code  Goals of Care:     05/05/2023    8:19 AM  Advanced Directives  Does Patient Have a Medical Advance Directive? No  Would patient like information on creating a medical advance directive? No - Patient declined     Chief Complaint  Patient presents with   Follow-up    Follow up after a fall    Quality Metric Gaps    Needs to discuss Covid vaccine and Fecal DNA (Cologuard)    HPI: Patient is a 67 y.o. female seen today for an acute visit for hospitalization follow up. She was brought in the ED on 04/25/23 after tripping on a stump at a park causing her to fall on her right knee to the ground. She was with her daughter who called EMS. IN the ED, imaging of right knee was negative for fracture. She was discharge home with ACE wrap and crutches.  She was accompanied by daughter today. Right knee down to the lower leg has bruising and 2+edema. She stated that she has 6/10 pain on her right hip and right lower leg. She, also, stated that after she eats, she has right jaw hurting. She takes Acetaminophen PRN for pain.   Essential hypertension  -  BP 127/88, takes Amlodipine  Chronic gout of right hand, unspecified cause -  no flare ups, takes Allopurinol  Obesity, morbid (more than 100 lbs over ideal weight or BMI > 40) (HCC)  -  weight 341, progressively gaining weight, daughter stated that she will cut down soda   Wt Readings from Last 3 Encounters:  05/05/23 (!) 341 lb 9.6 oz (154.9 kg)  04/09/23 (!) 339 lb (153.8 kg)  10/07/22 (!) 330 lb (149.7 kg)     Past Medical History:  Diagnosis Date   Allergic rhinitis, unspecified    Arthritis    Per [facility] new patient packet    Asthma    Per [facility] new patient packet    Gout, unspecified    Hyperglycemia    Per labs completed on 03/15/2019 @ [facility]   Hyperlipidemia    Hypertension    Irregular bowel habits    Per [facility] new patient packet    Right knee injury     Stretched veins in right knee    Upper respiratory infection    Varicose vein of leg    Right, Per [facility] new patient packet    Vitamin D deficiency     Past Surgical History:  Procedure Laterality Date   TUBAL LIGATION  1990   [facility]     Allergies  Allergen Reactions   Bactrim [Sulfamethoxazole-Trimethoprim]    Keflex [Cephalexin]    Nsaids Other (See Comments)    Told not to take due to kidney function   Omnicef [Cefdinir]    Sulfa Antibiotics     Outpatient Encounter Medications as of 05/05/2023  Medication Sig   acetaminophen (TYLENOL) 500 MG tablet Take 500 mg by mouth as needed.   albuterol (VENTOLIN HFA) 108 (90 Base) MCG/ACT inhaler INHALE 2 PUFFS BY MOUTH EVERY 6 HOURS AS NEEDED FOR WHEEZING OR SHORTNESS OF BREATH   Albuterol Sulfate 2.5 MG/0.5ML NEBU Inhale 0.5 mLs (2.5 mg total) into the lungs as needed.   allopurinol (ZYLOPRIM) 100 MG tablet TAKE 1 TABLET(100 MG) BY MOUTH DAILY   amLODipine (NORVASC) 5 MG tablet TAKE 1 TABLET(5 MG) BY MOUTH DAILY   Cholecalciferol (VITAMIN  D3 PO) Take 1 capsule by mouth daily.   famotidine (PEPCID) 20 MG tablet TAKE 1 TABLET BY MOUTH AFTER SUPPER   furosemide (LASIX) 40 MG tablet TAKE 1 AND 1/2 TABLETS BY MOUTH DAILY   metoprolol succinate (TOPROL-XL) 25 MG 24 hr tablet TAKE 1 TABLET(25 MG) BY MOUTH DAILY   montelukast (SINGULAIR) 10 MG tablet TAKE 1 TABLET(10 MG) BY MOUTH AT BEDTIME   nystatin (MYCOSTATIN/NYSTOP) powder Apply topically as needed.   nystatin cream (MYCOSTATIN) APPLY TOPICALLY TO THE AFFECTED AREA TWICE DAILY AS NEEDED FOR DRY SKIN   olmesartan (BENICAR) 20 MG tablet TAKE 1 TABLET(20 MG) BY MOUTH DAILY   pantoprazole (PROTONIX) 40 MG tablet TAKE 1 TABLET(40 MG) BY MOUTH DAILY 30 TO 60 MINUTES BEFORE FIRST MEAL OF THE DAY   potassium chloride (MICRO-K) 10 MEQ CR capsule TAKE 1 CAPSULE(10 MEQ) BY MOUTH DAILY   SYMBICORT 80-4.5 MCG/ACT inhaler INHALE 2 PUFFS INTO THE LUNGS TWICE DAILY   No  facility-administered encounter medications on file as of 05/05/2023.    Review of Systems:  Review of Systems  Constitutional:  Negative for appetite change, chills, fatigue and fever.  HENT:  Negative for congestion, hearing loss, rhinorrhea and sore throat.   Eyes: Negative.   Respiratory:  Negative for cough, shortness of breath and wheezing.   Cardiovascular:  Negative for chest pain, palpitations and leg swelling.  Gastrointestinal:  Negative for abdominal pain, constipation, diarrhea, nausea and vomiting.  Genitourinary:  Negative for dysuria.  Musculoskeletal:  Negative for arthralgias, back pain and myalgias.       Right jaw, hip and lower leg painful  Skin:  Negative for color change, rash and wound.  Neurological:  Negative for dizziness, weakness and headaches.  Psychiatric/Behavioral:  Negative for behavioral problems. The patient is not nervous/anxious.     Health Maintenance  Topic Date Due   COVID-19 Vaccine (8 - 2023-24 season) 11/21/2022   Fecal DNA (Cologuard)  02/14/2023   INFLUENZA VACCINE  05/20/2023   Medicare Annual Wellness (AWV)  09/08/2023   MAMMOGRAM  02/18/2025   DEXA SCAN  04/17/2026   DTaP/Tdap/Td (2 - Td or Tdap) 09/09/2029   Pneumonia Vaccine 53+ Years old  Completed   Hepatitis C Screening  Completed   Zoster Vaccines- Shingrix  Completed   HPV VACCINES  Aged Out    Physical Exam: Vitals:   05/05/23 0820  Height: 5\' 10"  (1.778 m)   Body mass index is 48.64 kg/m. Physical Exam Constitutional:      General: She is not in acute distress.    Appearance: She is obese.     Comments: Morbidly obese  HENT:     Head: Normocephalic and atraumatic.     Nose: Nose normal.     Mouth/Throat:     Mouth: Mucous membranes are moist.  Eyes:     Conjunctiva/sclera: Conjunctivae normal.  Cardiovascular:     Rate and Rhythm: Normal rate and regular rhythm.  Pulmonary:     Effort: Pulmonary effort is normal.     Breath sounds: Normal breath sounds.   Abdominal:     General: Bowel sounds are normal.     Palpations: Abdomen is soft.  Musculoskeletal:        General: Normal range of motion.     Cervical back: Normal range of motion.     Right lower leg: Edema present.     Comments: Right lower extremity 2+edema  Skin:    General: Skin is warm and dry.  Findings: Bruising present.     Comments: Bruising on right lower leg  Neurological:     General: No focal deficit present.     Mental Status: She is alert and oriented to person, place, and time.  Psychiatric:        Mood and Affect: Mood normal.        Behavior: Behavior normal.        Thought Content: Thought content normal.        Judgment: Judgment normal.     Labs reviewed: Basic Metabolic Panel: Recent Labs    10/07/22 0000 04/09/23 1605  NA 143 143  K 4.1 4.7  CL 105 105  CO2 29 32  GLUCOSE 102* 113*  BUN 18 21  CREATININE 1.05 1.06*  CALCIUM 9.5 9.4   Liver Function Tests: Recent Labs    10/07/22 0000 04/09/23 1605  AST 21 15  ALT 16 13  BILITOT 0.8 0.7  PROT 7.2 7.2   No results for input(s): "LIPASE", "AMYLASE" in the last 8760 hours. No results for input(s): "AMMONIA" in the last 8760 hours. CBC: Recent Labs    10/07/22 0000 04/09/23 1605  WBC 3.0* 4.7  NEUTROABS 834* 1,664  HGB 14.1 13.9  HCT 42.0 43.1  MCV 84.3 85.5  PLT 249 260   Lipid Panel: Recent Labs    10/07/22 0000  CHOL 160  HDL 64  LDLCALC 81  TRIG 72  CHOLHDL 2.5   Lab Results  Component Value Date   HGBA1C 6.1 (H) 04/09/2023    Procedures since last visit: DG Knee Complete 4 Views Right  Result Date: 04/25/2023 CLINICAL DATA:  Fall earlier today. EXAM: RIGHT KNEE - COMPLETE 4+ VIEW COMPARISON:  None Available. FINDINGS: No evidence of fracture, dislocation, or joint effusion. Mild degenerative change with medial tibiofemoral and patellofemoral spurring. No erosions. Scattered phleboliths in the soft tissues. IMPRESSION: 1. No fracture or subluxation of the  right knee. 2. Mild osteoarthritis. Electronically Signed   By: Narda Rutherford M.D.   On: 04/25/2023 19:29    Assessment/Plan  1. Status post fall -  was seen in the ED on 04/25/23 -  imaging was negative for fracture or dislocation -  WBAT with crutches -  apply ice packs to right leg TID and ACE wrap  2. Lower extremity edema -  continue Lasix  3. Essential hypertension -  BP stable -  continue Amlodipine  4. Chronic gout of right hand, unspecified cause Lab Results  Component Value Date   LABURIC 5.3 04/09/2023   -  stable -  continue Allopurinol  5. Obesity, morbid (more than 100 lbs over ideal weight or BMI > 40) (HCC) Body mass index is 49.01 kg/m. -  instructed to eat more of vegetable, will cut down on soda or sweet foods -  plans to do 20 lbs by December 2024     Labs/tests ordered:  None  Next appt:  09/21/2023

## 2023-05-17 ENCOUNTER — Other Ambulatory Visit: Payer: Self-pay | Admitting: Nurse Practitioner

## 2023-05-17 DIAGNOSIS — J45991 Cough variant asthma: Secondary | ICD-10-CM

## 2023-05-27 ENCOUNTER — Other Ambulatory Visit: Payer: Self-pay | Admitting: Nurse Practitioner

## 2023-05-28 ENCOUNTER — Other Ambulatory Visit: Payer: Self-pay | Admitting: Nurse Practitioner

## 2023-05-28 DIAGNOSIS — I10 Essential (primary) hypertension: Secondary | ICD-10-CM

## 2023-05-28 DIAGNOSIS — I83893 Varicose veins of bilateral lower extremities with other complications: Secondary | ICD-10-CM

## 2023-05-31 ENCOUNTER — Other Ambulatory Visit: Payer: Self-pay | Admitting: Nurse Practitioner

## 2023-05-31 DIAGNOSIS — B372 Candidiasis of skin and nail: Secondary | ICD-10-CM

## 2023-06-20 ENCOUNTER — Ambulatory Visit
Admission: EM | Admit: 2023-06-20 | Discharge: 2023-06-20 | Disposition: A | Discharge status: Home / Self Care | Payer: Medicare PPO | Attending: Physician Assistant | Admitting: Physician Assistant

## 2023-06-20 DIAGNOSIS — R110 Nausea: Secondary | ICD-10-CM

## 2023-06-20 LAB — POCT FASTING CBG KUC MANUAL ENTRY: POCT Glucose (KUC): 132 mg/dL — AB (ref 70–99)

## 2023-06-20 MED ORDER — ONDANSETRON 4 MG PO TBDP: 4.0000 mg | ORAL_TABLET | Freq: Three times a day (TID) | ORAL | 0 refills | Status: AC | PRN

## 2023-06-20 NOTE — ED Triage Notes (Signed)
"  I have been having Nausea since Monday". It comes and goes "more nausea with standing and walking". A lot of gas on my stomach. No fever. No Chest Pain. No sob. COVID19 test at home "Negative".

## 2023-06-20 NOTE — ED Provider Notes (Signed)
[facility]    CSN: [id number] Arrival date & time: 06/20/23  1210      History   Chief Complaint Chief Complaint  Patient presents with   Nausea    HPI Traci Gomez is a 67 y.o. female.   Patient here today for evaluation of nausea that she has had since Monday.  She denies any vomiting or diarrhea.  She states she has had some gas.  She denies any fever.  She has not had any chest pain or shortness of breath.  She does report that she did have some nausea with standing and walking but this has improved as have her other symptoms.    The history is provided by the patient.    Past Medical History:  Diagnosis Date   Allergic rhinitis, unspecified    Arthritis    Per [facility] new patient packet    Asthma    Per [facility] new patient packet    Gout, unspecified    Hyperglycemia    Per labs completed on 03/15/2019 @ [facility]   Hyperlipidemia    Hypertension    Irregular bowel habits    Per [facility] new patient packet    Right knee injury    Stretched veins in right knee    Upper respiratory infection    Varicose vein of leg    Right, Per [facility] new patient packet    Vitamin D deficiency     Patient Active Problem List   Diagnosis Date Noted   Morbid (severe) obesity due to excess calories (HCC) 06/03/2019   Vitamin D deficiency    Gout, unspecified    Hyperglycemia    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      Home Medications    Prior to Admission medications   Medication Sig Start Date End Date Taking? Authorizing Provider  amLODipine (NORVASC) 5 MG tablet TAKE 1 TABLET(5 MG) BY MOUTH DAILY 04/01/23  Yes Sharon Seller, NP  famotidine (PEPCID) 20 MG tablet TAKE 1 TABLET BY MOUTH AFTER SUPPER 05/17/23  Yes Sharon Seller, NP  furosemide (LASIX) 40 MG tablet  TAKE 1 AND 1/2 TABLETS BY MOUTH DAILY 01/05/23  Yes Sharon Seller, NP  metoprolol succinate (TOPROL-XL) 25 MG 24 hr tablet TAKE 1 TABLET(25 MG) BY MOUTH DAILY 01/05/23  Yes Sharon Seller, NP  montelukast (SINGULAIR) 10 MG tablet TAKE 1 TABLET(10 MG) BY MOUTH AT BEDTIME 05/27/23  Yes Sharon Seller, NP  olmesartan (BENICAR) 20 MG tablet TAKE 1 TABLET(20 MG) BY MOUTH DAILY 01/05/23  Yes Sharon Seller, NP  ondansetron (ZOFRAN-ODT) 4 MG disintegrating tablet Take 1 tablet (4 mg total) by mouth every 8 (eight) hours as needed. 06/20/23  Yes Tomi Bamberger, PA-C  potassium chloride (MICRO-K) 10 MEQ CR capsule TAKE 1 CAPSULE(10 MEQ) BY MOUTH DAILY 03/01/23  Yes Sharon Seller, NP  acetaminophen (TYLENOL) 500 MG tablet Take 500 mg by mouth as needed.    [provider]  albuterol (VENTOLIN HFA) 108 (90 Base) MCG/ACT inhaler INHALE 2 PUFFS BY MOUTH EVERY 6 HOURS AS NEEDED FOR WHEEZING OR SHORTNESS OF BREATH 06/11/22   Sharon Seller, NP  Albuterol Sulfate 2.5 MG/0.5ML NEBU Inhale 0.5  mLs (2.5 mg total) into the lungs as needed. 04/16/20   Sharon Seller, NP  allopurinol (ZYLOPRIM) 100 MG tablet TAKE 1 TABLET(100 MG) BY MOUTH DAILY 03/01/23   Sharon Seller, NP  Cholecalciferol (VITAMIN D3 PO) Take 1 capsule by mouth daily.    [provider]  nystatin (MYCOSTATIN/NYSTOP) powder Apply topically as needed.    [provider]  nystatin cream (MYCOSTATIN) Apply to skin twice daily as needed yeast 05/31/23   Sharon Seller, NP  pantoprazole (PROTONIX) 40 MG tablet TAKE 1 TABLET(40 MG) BY MOUTH DAILY 30 TO 60 MINUTES BEFORE FIRST MEAL OF THE DAY 04/02/23   Sharon Seller, NP  SYMBICORT 80-4.5 MCG/ACT inhaler INHALE 2 PUFFS INTO THE LUNGS TWICE DAILY 01/05/23   Sharon Seller, NP    Family History Family History  Problem Relation Age of Onset   High blood pressure Mother    Hypertension Mother    Heart disease Father    COPD Father    Heart  disease Brother     Social History Social History   Tobacco Use   Smoking status: Never   Smokeless tobacco: Never  Vaping Use   Vaping status: Never Used  Substance Use Topics   Alcohol use: Not Currently   Drug use: Not Currently     Allergies   Bactrim [sulfamethoxazole-trimethoprim], Keflex [cephalexin], Nsaids, Omnicef [cefdinir], and Sulfa antibiotics   Review of Systems Review of Systems  Constitutional:  Negative for chills and fever.  Eyes:  Negative for discharge and redness.  Respiratory:  Negative for shortness of breath.   Gastrointestinal:  Positive for nausea. Negative for abdominal pain, blood in stool, constipation, diarrhea and vomiting.     Physical Exam Triage Vital Signs ED Triage Vitals  Encounter Vitals Group     BP 06/20/23 1223 130/74     Systolic BP Percentile --      Diastolic BP Percentile --      Pulse Rate 06/20/23 1223 89     Resp 06/20/23 1223 20     Temp 06/20/23 1223 98.1 F (36.7 C)     Temp Source 06/20/23 1223 Oral     SpO2 06/20/23 1223 98 %     Weight 06/20/23 1219 (!) 350 lb (158.8 kg)     Height 06/20/23 1219 5\' 9"  (1.753 m)     Head Circumference --      Peak Flow --      Pain Score 06/20/23 1218 0     Pain Loc --      Pain Education --      Exclude from Growth Chart --    No data found.  Updated Vital Signs BP 130/74 (BP Location: Left Arm)   Pulse 89   Temp 98.1 F (36.7 C) (Oral)   Resp 20   Ht 5\' 9"  (1.753 m)   Wt (!) 350 lb (158.8 kg)   SpO2 98%   BMI 51.69 kg/m      Physical Exam Vitals and nursing note reviewed.  Constitutional:      General: She is not in acute distress.    Appearance: Normal appearance. She is not ill-appearing.  HENT:     Head: Normocephalic and atraumatic.  Eyes:     Conjunctiva/sclera: Conjunctivae normal.  Cardiovascular:     Rate and Rhythm: Normal rate and regular rhythm.  Pulmonary:     Effort: Pulmonary effort is normal. No respiratory distress.     Breath  sounds: Normal  breath sounds. No wheezing, rhonchi or rales.  Abdominal:     General: Abdomen is flat. Bowel sounds are normal. There is no distension.     Palpations: Abdomen is soft.     Tenderness: There is no abdominal tenderness. There is no guarding or rebound.  Neurological:     Mental Status: She is alert.  Psychiatric:        Mood and Affect: Mood normal.        Behavior: Behavior normal.        Thought Content: Thought content normal.      UC Treatments / Results  Labs (all labs ordered are listed, but only abnormal results are displayed) Labs Reviewed  POCT FASTING CBG [facility] MANUAL ENTRY - Abnormal; Notable for the following components:      Result Value   POCT Glucose ([facility]) 132 (*)    All other components within normal limits    EKG   Radiology No results found.  Procedures Procedures (including critical care time)  Medications Ordered in UC Medications - No data to display  Initial Impression / Assessment and Plan / UC Course  I have reviewed the triage vital signs and the nursing notes.  Pertinent labs & imaging results that were available during my care of the patient were reviewed by me and considered in my medical decision making (see chart for details).    EKG ordered given worsening symptoms with activity, however appears similar to prior.  Suspect possible viral etiology of symptoms and recommended Zofran for nausea.  Encouraged follow-up in the emergency room if symptoms do not continue to improve or with any worsening as she may need stat imaging and stat labs at that time.  Patient expresses understanding.  Final Clinical Impressions(s) / UC Diagnoses   Final diagnoses:  Nausea without vomiting   Discharge Instructions   None    ED Prescriptions     Medication Sig Dispense Auth. Provider   ondansetron (ZOFRAN-ODT) 4 MG disintegrating tablet Take 1 tablet (4 mg total) by mouth every 8 (eight) hours as needed. 20 tablet Tomi Bamberger,  PA-C      PDMP not reviewed this encounter.   Tomi Bamberger, PA-C 06/20/23 1616

## 2023-06-24 ENCOUNTER — Other Ambulatory Visit: Payer: Self-pay | Admitting: Nurse Practitioner

## 2023-07-10 ENCOUNTER — Other Ambulatory Visit: Payer: Self-pay | Admitting: Nurse Practitioner

## 2023-07-10 DIAGNOSIS — J45991 Cough variant asthma: Secondary | ICD-10-CM

## 2023-08-27 ENCOUNTER — Other Ambulatory Visit: Payer: Self-pay | Admitting: Nurse Practitioner

## 2023-08-27 DIAGNOSIS — M1A0410 Idiopathic chronic gout, right hand, without tophus (tophi): Secondary | ICD-10-CM

## 2023-08-27 DIAGNOSIS — I83893 Varicose veins of bilateral lower extremities with other complications: Secondary | ICD-10-CM

## 2023-08-27 DIAGNOSIS — I10 Essential (primary) hypertension: Secondary | ICD-10-CM

## 2023-09-19 ENCOUNTER — Encounter: Payer: Self-pay | Admitting: Nurse Practitioner

## 2023-09-20 ENCOUNTER — Ambulatory Visit (INDEPENDENT_AMBULATORY_CARE_PROVIDER_SITE_OTHER): Payer: Medicare PPO | Admitting: Nurse Practitioner

## 2023-09-20 ENCOUNTER — Encounter: Payer: Medicare PPO | Admitting: Nurse Practitioner

## 2023-09-20 ENCOUNTER — Encounter: Payer: Self-pay | Admitting: Nurse Practitioner

## 2023-09-20 VITALS — BP 132/86 | HR 75 | Temp 97.1°F | Ht 69.0 in | Wt 345.0 lb

## 2023-09-20 DIAGNOSIS — N644 Mastodynia: Secondary | ICD-10-CM

## 2023-09-20 DIAGNOSIS — Z0000 Encounter for general adult medical examination without abnormal findings: Secondary | ICD-10-CM

## 2023-09-20 NOTE — Patient Instructions (Signed)
  [name] , Thank you for taking time to come for your Medicare Wellness Visit. I appreciate your ongoing commitment to your health goals. Please review the following plan we discussed and let me know if I can assist you in the future.   This is a list of the screening recommended for you and due dates:  Health Maintenance  Topic Date Due   Medicare Annual Wellness Visit  09/19/2024   Mammogram  02/18/2025   DEXA scan (bone density measurement)  04/17/2026   Cologuard (Stool DNA test)  04/27/2026   DTaP/Tdap/Td vaccine (2 - Td or Tdap) 09/09/2029   Pneumonia Vaccine  Completed   Flu Shot  Completed   COVID-19 Vaccine  Completed   Hepatitis C Screening  Completed   Zoster (Shingles) Vaccine  Completed   HPV Vaccine  Aged Out   [facility] number is [phone]

## 2023-09-20 NOTE — Progress Notes (Signed)
Subjective:   Traci Gomez is a 67 y.o. female who presents for Medicare Annual (Subsequent) preventive examination.  Visit Complete: In person   Cardiac Risk Factors include: advanced age (>20men, >93 women);obesity (BMI >30kg/m2);hypertension;dyslipidemia     Objective:    Today's Vitals   09/20/23 1024  BP: 132/86  Pulse: 75  Temp: (!) 97.1 F (36.2 C)  TempSrc: Temporal  SpO2: 98%  Weight: (!) 345 lb (156.5 kg)  Height: 5\' 9"  (1.753 m)   Body mass index is 50.95 kg/m.     09/20/2023   10:25 AM 05/05/2023    8:19 AM 04/25/2023    6:54 PM 04/09/2023    3:12 PM 10/07/2022    9:14 AM 04/06/2022    8:17 AM 09/08/2021    9:45 AM  Advanced Directives  Does Patient Have a Medical Advance Directive? No No No No No No No  Would patient like information on creating a medical advance directive? Yes (MAU/Ambulatory/Procedural Areas - Information given) No - Patient declined  No - Patient declined No - Patient declined No - Patient declined No - Patient declined    Current Medications (verified) Outpatient Encounter Medications as of 09/20/2023  Medication Sig   acetaminophen (TYLENOL) 500 MG tablet Take 500 mg by mouth as needed.   albuterol (VENTOLIN HFA) 108 (90 Base) MCG/ACT inhaler INHALE 2 PUFFS BY MOUTH EVERY 6 HOURS AS NEEDED FOR WHEEZING OR SHORTNESS OF BREATH   Albuterol Sulfate 2.5 MG/0.5ML NEBU Inhale 0.5 mLs (2.5 mg total) into the lungs as needed.   allopurinol (ZYLOPRIM) 100 MG tablet TAKE 1 TABLET(100 MG) BY MOUTH DAILY   amLODipine (NORVASC) 5 MG tablet TAKE 1 TABLET(5 MG) BY MOUTH DAILY   Cholecalciferol (VITAMIN D3 PO) Take 1 capsule by mouth daily.   famotidine (PEPCID) 20 MG tablet TAKE 1 TABLET BY MOUTH AFTER SUPPER   furosemide (LASIX) 40 MG tablet TAKE 1 AND 1/2 TABLETS BY MOUTH DAILY   metoprolol succinate (TOPROL-XL) 25 MG 24 hr tablet TAKE 1 TABLET(25 MG) BY MOUTH DAILY   montelukast (SINGULAIR) 10 MG tablet TAKE 1 TABLET(10 MG) BY MOUTH AT BEDTIME    nystatin (MYCOSTATIN/NYSTOP) powder Apply topically as needed.   nystatin cream (MYCOSTATIN) Apply to skin twice daily as needed yeast   olmesartan (BENICAR) 20 MG tablet TAKE 1 TABLET(20 MG) BY MOUTH DAILY   ondansetron (ZOFRAN-ODT) 4 MG disintegrating tablet Take 1 tablet (4 mg total) by mouth every 8 (eight) hours as needed.   pantoprazole (PROTONIX) 40 MG tablet TAKE 1 TABLET(40 MG) BY MOUTH DAILY 30 TO 60 MINUTES BEFORE FIRST MEAL OF THE DAY   potassium chloride (MICRO-K) 10 MEQ CR capsule TAKE 1 CAPSULE(10 MEQ) BY MOUTH DAILY   SYMBICORT 80-4.5 MCG/ACT inhaler INHALE 2 PUFFS INTO THE LUNGS TWICE DAILY   No facility-administered encounter medications on file as of 09/20/2023.    Allergies (verified) Bactrim [sulfamethoxazole-trimethoprim], Keflex [cephalexin], Nsaids, Omnicef [cefdinir], and Sulfa antibiotics   History: Past Medical History:  Diagnosis Date   Allergic rhinitis, unspecified    Arthritis    Per [facility] new patient packet    Asthma    Per [facility] new patient packet    Gout, unspecified    Hyperglycemia    Per labs completed on 03/15/2019 @ [facility]   Hyperlipidemia    Hypertension    Irregular bowel habits    Per [facility] new patient packet    Right knee injury    Stretched veins in right knee  Upper respiratory infection    Varicose vein of leg    Right, Per [facility] new patient packet    Vitamin D deficiency    Past Surgical History:  Procedure Laterality Date   TUBAL LIGATION  1990   [facility] [place]    Family History  Problem Relation Age of Onset   High blood pressure Mother    Hypertension Mother    Heart disease Father    COPD Father    Heart disease Brother    Social History   Socioeconomic History   Marital status: Married    Spouse name: Not on file   Number of children: Not on file   Years of education: Not on file   Highest education level: Not on file  Occupational History   Not on file  Tobacco Use   Smoking status: Never   Smokeless  tobacco: Never  Vaping Use   Vaping status: Never Used  Substance and Sexual Activity   Alcohol use: Not Currently   Drug use: Not Currently   Sexual activity: Yes    Birth control/protection: None    Comment: Married  Other Topics Concern   Not on file  Social History Narrative   Per [facility] new patient packet:      Diet: N/A      Caffeine: Yes, Soda       Married, if yes what year: Yes, 1975      Do you live in a house, apartment, assisted living, condo, trailer, ect: House, 2 stories       Pets: No      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 No   Do you have difficulty preparing food or eating? No   Do you have difficulty managing your medications? No   Do you have difficulty managing your finances? No   Do you have difficulty affording your medications? No   Social Determinants of Corporate investment banker Strain: Not on file  Food Insecurity: Not on file  Transportation Needs: Not on file  Physical Activity: Not on file  Stress: Not on file  Social Connections: Not on file    Tobacco Counseling Counseling given: Not Answered   Clinical Intake:  Pre-visit preparation completed: Yes  Pain : No/denies pain     BMI - recorded: 50 Nutritional Status: BMI > 30  Obese Diabetes: No  How often do you need to have someone help you when you read instructions, pamphlets, or other written materials from your doctor or pharmacy?: 1 - Never         Activities of Daily Living    09/20/2023   10:39 AM  In your present state of health, do you have any difficulty performing the following activities:  Hearing? 0  Vision? 0  Difficulty concentrating or making decisions? 0  Walking or climbing stairs? 0  Dressing or bathing? 0  Doing errands, shopping? 0  Preparing Food and  eating ? N  Using the Toilet? N  In the past six months, have you accidently leaked urine? N  Do you have problems with loss of bowel control? N  Managing your Medications? N  Managing your Finances? N  Housekeeping or managing your Housekeeping?  N    Patient Care Team: Sharon Seller, NP as PCP - General (Geriatric Medicine)  Indicate any recent Medical Services you may have received from other than [facility] providers in the past year (date may be approximate).     Assessment:   This is a routine wellness examination for [place].  Hearing/Vision screen Hearing Screening - Comments:: No hearing issues  Vision Screening - Comments:: Last eye exam less than 12 months ago with Dr. Valorie Roosevelt in [place]    Goals Addressed   None    Depression Screen    09/20/2023   10:23 AM 05/05/2023    8:51 AM 04/09/2023    3:11 PM 09/07/2022    1:58 PM 09/08/2021    9:45 AM 07/29/2021    1:15 PM 08/12/2020    9:30 AM  PHQ 2/9 Scores  PHQ - 2 Score 0 0 0 0 0 0 0  PHQ- 9 Score  0         Fall Risk    09/20/2023   10:23 AM 05/05/2023    8:50 AM 04/09/2023    3:11 PM 10/07/2022    9:14 AM 09/07/2022    1:58 PM  Fall Risk   Falls in the past year? 1 0 0 0 0  Number falls in past yr: 0 0 0 0   Injury with Fall? 1 0 0 0   Comment Sprain      Risk for fall due to : History of fall(s) History of fall(s) No Fall Risks No Fall Risks   Follow up Falls evaluation completed Falls evaluation completed Falls evaluation completed Falls evaluation completed     MEDICARE RISK AT HOME:    TIMED UP AND GO:  Was the test performed?  No    Cognitive Function:    09/20/2023   10:30 AM  MMSE - Mini Mental State Exam  Orientation to time 5  Orientation to Place 4  Registration 3  Attention/ Calculation 5  Recall 2  Recall-comments Missed Table  Language- name 2 objects 2  Language- repeat 1  Language- follow 3 step command 3  Language- read & follow direction 1  Write a sentence 1  Copy  design 1  Total score 28        09/07/2022    1:59 PM 07/29/2021    1:18 PM  6CIT Screen  What Year? 0 points 0 points  What month? 0 points 0 points  What time? 0 points 0 points  Count back from 20 0 points 0 points  Months in reverse 0 points 0 points  Repeat phrase 0 points 4 points  Total Score 0 points 4 points    Immunizations Immunization History  Administered Date(s) Administered   Influenza, High Dose Seasonal PF 08/06/2021, 06/20/2023   Influenza,inj,Quad PF,6+ Mos 07/27/2019   Influenza-Unspecified 07/26/2020, 06/19/2022   Moderna Covid-19 Fall Seasonal Vaccine 13yrs & older 07/21/2022, 07/20/2023   Moderna Covid-19 Vaccine Bivalent Booster 16yrs & up 04/29/2022   Moderna Sars-Covid-2 Vaccination 10/25/2019, 11/23/2019, 09/04/2020, 01/28/2021, 07/24/2021   PNEUMOCOCCAL CONJUGATE-20 08/27/2021   Pneumococcal Conjugate-13 08/12/2020   Tdap 09/10/2019   Zoster Recombinant(Shingrix) 04/28/2021, 06/27/2021    TDAP status: Up to date  Flu Vaccine status: Up to date  Pneumococcal vaccine status: Up to date  Covid-19 vaccine status: Information provided on how to obtain vaccines.   Qualifies for Shingles Vaccine? Yes   Zostavax completed No   Shingrix Completed?: Yes  Screening Tests Health Maintenance  Topic  Date Due   Medicare Annual Wellness (AWV)  09/19/2024   MAMMOGRAM  02/18/2025   DEXA SCAN  04/17/2026   Fecal DNA (Cologuard)  04/27/2026   DTaP/Tdap/Td (2 - Td or Tdap) 09/09/2029   Pneumonia Vaccine 57+ Years old  Completed   INFLUENZA VACCINE  Completed   COVID-19 Vaccine  Completed   Hepatitis C Screening  Completed   Zoster Vaccines- Shingrix  Completed   HPV VACCINES  Aged Out    Health Maintenance  There are no preventive care reminders to display for this patient.   Colorectal cancer screening: Type of screening: Cologuard. Completed 04/28/2023. Repeat every 3 years  Mammogram status: Completed 02/19/2023. Repeat every year  Bone  Density status: Completed 04/17/21. Results reflect: Bone density results: NORMAL. Repeat every 5 years.  Lung Cancer Screening: (Low Dose CT Chest recommended if Age 58-80 years, 20 pack-year currently smoking OR have quit w/in 15years.) does not qualify.   Lung Cancer Screening Referral: na  Additional Screening:  Hepatitis C Screening: does qualify; Completed   Vision Screening: Recommended annual ophthalmology exams for early detection of glaucoma and other disorders of the eye. Is the patient up to date with their annual eye exam?  Yes  Who is the provider or what is the name of the office in which the patient attends annual eye exams? Dr. Valorie Roosevelt  If pt is not established with a provider, would they like to be referred to a provider to establish care? No .   Dental Screening: Recommended annual dental exams for proper oral hygiene   Community Resource Referral / Chronic Care Management: CRR required this visit?  No   CCM required this visit?  No     Plan:     I have personally reviewed and noted the following in the patient's chart:   Medical and social history Use of alcohol, tobacco or illicit drugs  Current medications and supplements including opioid prescriptions. Patient is not currently taking opioid prescriptions. Functional ability and status Nutritional status Physical activity Advanced directives List of other physicians Hospitalizations, surgeries, and ER visits in previous 12 months Vitals Screenings to include cognitive, depression, and falls Referrals and appointments  In addition, I have reviewed and discussed with patient certain preventive protocols, quality metrics, and best practice recommendations. A written personalized care plan for preventive services as well as general preventive health recommendations were provided to patient.     Sharon Seller, NP   09/20/2023   After Visit Summary: (In Person-Printed) AVS printed and given to the  patient

## 2023-09-21 ENCOUNTER — Other Ambulatory Visit: Payer: Self-pay | Admitting: Nurse Practitioner

## 2023-09-21 ENCOUNTER — Encounter: Payer: Medicare PPO | Admitting: Nurse Practitioner

## 2023-09-21 DIAGNOSIS — N644 Mastodynia: Secondary | ICD-10-CM

## 2023-10-05 ENCOUNTER — Other Ambulatory Visit: Payer: Self-pay | Admitting: Nurse Practitioner

## 2023-10-05 DIAGNOSIS — I10 Essential (primary) hypertension: Secondary | ICD-10-CM

## 2023-10-08 ENCOUNTER — Encounter: Payer: Self-pay | Admitting: Nurse Practitioner

## 2023-10-08 ENCOUNTER — Ambulatory Visit: Payer: Medicare PPO | Admitting: Nurse Practitioner

## 2023-10-08 VITALS — BP 132/80 | HR 70 | Temp 97.9°F | Ht 69.0 in | Wt 339.0 lb

## 2023-10-08 DIAGNOSIS — J45991 Cough variant asthma: Secondary | ICD-10-CM

## 2023-10-08 DIAGNOSIS — M1A0410 Idiopathic chronic gout, right hand, without tophus (tophi): Secondary | ICD-10-CM

## 2023-10-08 DIAGNOSIS — N644 Mastodynia: Secondary | ICD-10-CM

## 2023-10-08 DIAGNOSIS — I10 Essential (primary) hypertension: Secondary | ICD-10-CM

## 2023-10-08 DIAGNOSIS — R739 Hyperglycemia, unspecified: Secondary | ICD-10-CM

## 2023-10-08 DIAGNOSIS — E785 Hyperlipidemia, unspecified: Secondary | ICD-10-CM

## 2023-10-08 NOTE — Patient Instructions (Signed)
Well fitting bra for support Decrease caffeine Hot or cold compresses

## 2023-10-08 NOTE — Progress Notes (Signed)
Careteam: Patient Care Team: Sharon Seller, NP as PCP - General (Geriatric Medicine)  PLACE OF SERVICE:  [facility]  Advanced Directive information Does Patient Have a Medical Advance Directive?: No, Would patient like information on creating a medical advance directive?: Yes (MAU/Ambulatory/Procedural Areas - Information given) (Given at previous visi)  Allergies  Allergen Reactions   Bactrim [Sulfamethoxazole-Trimethoprim]    Keflex [Cephalexin]    Nsaids Other (See Comments)    Told not to take due to kidney function   Omnicef [Cefdinir]    Sulfa Antibiotics     Chief Complaint  Patient presents with   Medical Management of Chronic Issues    6 month follow-up.      HPI: Patient is a 67 y.o. female for routine follow up   Awaiting to get mamogram and ultrasound on breast due to ongoing pain- wants to have this done in [place].  Not hurting unless she touches breast.  Has been drinking more caffeine Does not drink alcohol.   She is trying to lose weight.   No recent gout or flares  Asthma is well controlled.   Blood pressure at goal  No GERD/acid  Bowels moving well.  Sleeping well,  mood has been good.     Review of Systems:  Review of Systems  Constitutional:  Negative for chills, fever and weight loss.  HENT:  Negative for tinnitus.   Respiratory:  Negative for cough, sputum production and shortness of breath.   Cardiovascular:  Negative for chest pain, palpitations and leg swelling.  Gastrointestinal:  Negative for abdominal pain, constipation, diarrhea and heartburn.  Genitourinary:  Negative for dysuria, frequency and urgency.  Musculoskeletal:  Negative for back pain, falls, joint pain and myalgias.       Continues to have breast pain in the right  Skin: Negative.   Neurological:  Negative for dizziness and headaches.  Psychiatric/Behavioral:  Negative for depression and memory loss. The patient does not have insomnia.     Past  Medical History:  Diagnosis Date   Allergic rhinitis, unspecified    Arthritis    Per [facility] new patient packet    Asthma    Per [facility] new patient packet    Gout, unspecified    Hyperglycemia    Per labs completed on 03/15/2019 @ [facility]   Hyperlipidemia    Hypertension    Irregular bowel habits    Per [facility] new patient packet    Right knee injury    Stretched veins in right knee    Upper respiratory infection    Varicose vein of leg    Right, Per [facility] new patient packet    Vitamin D deficiency    Past Surgical History:  Procedure Laterality Date   TUBAL LIGATION  1990   [facility]    Social History:   reports that she has never smoked. She has never used smokeless tobacco. She reports that she does not currently use alcohol. She reports that she does not currently use drugs.  Family History  Problem Relation Age of Onset   High blood pressure Mother    Hypertension Mother    Heart disease Father    COPD Father    Heart disease Brother     Medications: Patient's Medications  New Prescriptions   No medications on file  Previous Medications   ACETAMINOPHEN (TYLENOL) 500 MG TABLET    Take 500 mg by mouth as needed.   ALBUTEROL (VENTOLIN HFA) 108 (90 BASE)  MCG/ACT INHALER    INHALE 2 PUFFS BY MOUTH EVERY 6 HOURS AS NEEDED FOR WHEEZING OR SHORTNESS OF BREATH   ALBUTEROL SULFATE 2.5 MG/0.5ML NEBU    Inhale 0.5 mLs (2.5 mg total) into the lungs as needed.   ALLOPURINOL (ZYLOPRIM) 100 MG TABLET    TAKE 1 TABLET(100 MG) BY MOUTH DAILY   AMLODIPINE (NORVASC) 5 MG TABLET    TAKE 1 TABLET(5 MG) BY MOUTH DAILY   CHOLECALCIFEROL (VITAMIN D3 PO)    Take 1 capsule by mouth daily.   FAMOTIDINE (PEPCID) 20 MG TABLET    TAKE 1 TABLET BY MOUTH AFTER SUPPER   FUROSEMIDE (LASIX) 40 MG TABLET    TAKE 1 AND 1/2 TABLETS BY MOUTH DAILY   METOPROLOL SUCCINATE (TOPROL-XL) 25 MG 24 HR TABLET    TAKE 1 TABLET(25 MG) BY MOUTH DAILY   MONTELUKAST (SINGULAIR) 10 MG TABLET    TAKE 1 TABLET(10 MG)  BY MOUTH AT BEDTIME   NYSTATIN (MYCOSTATIN/NYSTOP) POWDER    Apply topically as needed.   NYSTATIN CREAM (MYCOSTATIN)    Apply to skin twice daily as needed yeast   OLMESARTAN (BENICAR) 20 MG TABLET    TAKE 1 TABLET(20 MG) BY MOUTH DAILY   ONDANSETRON (ZOFRAN-ODT) 4 MG DISINTEGRATING TABLET    Take 1 tablet (4 mg total) by mouth every 8 (eight) hours as needed.   PANTOPRAZOLE (PROTONIX) 40 MG TABLET    TAKE 1 TABLET(40 MG) BY MOUTH DAILY 30 TO 60 MINUTES BEFORE FIRST MEAL OF THE DAY   POTASSIUM CHLORIDE (MICRO-K) 10 MEQ CR CAPSULE    TAKE 1 CAPSULE(10 MEQ) BY MOUTH DAILY   SYMBICORT 80-4.5 MCG/ACT INHALER    INHALE 2 PUFFS INTO THE LUNGS TWICE DAILY  Modified Medications   No medications on file  Discontinued Medications   No medications on file    Physical Exam:  Vitals:   10/08/23 0841  BP: 132/80  Pulse: 70  Temp: 97.9 F (36.6 C)  TempSrc: Temporal  SpO2: 98%  Weight: (!) 339 lb (153.8 kg)  Height: 5\' 9"  (1.753 m)   Body mass index is 50.06 kg/m. Wt Readings from Last 3 Encounters:  10/08/23 (!) 339 lb (153.8 kg)  09/20/23 (!) 345 lb (156.5 kg)  06/20/23 (!) 350 lb (158.8 kg)    Physical Exam Constitutional:      General: She is not in acute distress.    Appearance: She is well-developed. She is not diaphoretic.  HENT:     Head: Normocephalic and atraumatic.     Mouth/Throat:     Pharynx: No oropharyngeal exudate.  Eyes:     Conjunctiva/sclera: Conjunctivae normal.     Pupils: Pupils are equal, round, and reactive to light.  Cardiovascular:     Rate and Rhythm: Normal rate and regular rhythm.     Heart sounds: Normal heart sounds.  Pulmonary:     Effort: Pulmonary effort is normal.     Breath sounds: Normal breath sounds.  Abdominal:     General: Bowel sounds are normal.     Palpations: Abdomen is soft.  Musculoskeletal:     Cervical back: Normal range of motion and neck supple.     Right lower leg: No edema.     Left lower leg: No edema.  Skin:     General: Skin is warm and dry.  Neurological:     Mental Status: She is alert.  Psychiatric:        Mood and Affect: Mood normal.  Labs reviewed: Basic Metabolic Panel: Recent Labs    04/09/23 1605  NA 143  K 4.7  CL 105  CO2 32  GLUCOSE 113*  BUN 21  CREATININE 1.06*  CALCIUM 9.4   Liver Function Tests: Recent Labs    04/09/23 1605  AST 15  ALT 13  BILITOT 0.7  PROT 7.2   No results for input(s): "LIPASE", "AMYLASE" in the last 8760 hours. No results for input(s): "AMMONIA" in the last 8760 hours. CBC: Recent Labs    04/09/23 1605  WBC 4.7  NEUTROABS 1,664  HGB 13.9  HCT 43.1  MCV 85.5  PLT 260   Lipid Panel: No results for input(s): "CHOL", "HDL", "LDLCALC", "TRIG", "CHOLHDL", "LDLDIRECT" in the last 8760 hours. TSH: No results for input(s): "TSH" in the last 8760 hours. A1C: Lab Results  Component Value Date   HGBA1C 6.1 (H) 04/09/2023     Assessment/Plan 1. Breast pain, right (Primary) Ongoing, no lumps noted, she plans to call the breast center to schedule diagnotic imaging Has increased caffeine intake recently so will plan to reduce this to see if caffeine is contributing.   2. Chronic gout of right hand, unspecified cause -stable, no recent flares  3. Essential hypertension -Blood pressure well controlled, goal bp <140/90 Continue current medications and dietary modifications follow metabolic panel - COMPLETE METABOLIC PANEL WITH GFR - CBC with Differential/Platelet  4. 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 Cough variant asthma Well controlled on current regimen   6. Hyperlipidemia, unspecified hyperlipidemia type -continue dietary modifications - Lipid panel - COMPLETE METABOLIC PANEL WITH GFR  7. Hyperglycemia Continue dietary modifications  - Hemoglobin A1c    Return in about 6  months (around 04/07/2024) for routine follow up .  Janene Harvey. Biagio Borg [facility] [phone]

## 2023-10-09 LAB — CBC WITH DIFFERENTIAL/PLATELET
Absolute Lymphocytes: 1718 {cells}/uL (ref 850–3900)
Absolute Monocytes: 374 {cells}/uL (ref 200–950)
Basophils Absolute: 19 {cells}/uL (ref 0–200)
Basophils Relative: 0.6 %
Eosinophils Absolute: 38 {cells}/uL (ref 15–500)
Eosinophils Relative: 1.2 %
HCT: 44.7 % (ref 35.0–45.0)
Hemoglobin: 14.9 g/dL (ref 11.7–15.5)
MCH: 28.6 pg (ref 27.0–33.0)
MCHC: 33.3 g/dL (ref 32.0–36.0)
MCV: 85.8 fL (ref 80.0–100.0)
MPV: 11 fL (ref 7.5–12.5)
Monocytes Relative: 11.7 %
Neutro Abs: 1050 {cells}/uL — ABNORMAL LOW (ref 1500–7800)
Neutrophils Relative %: 32.8 %
Platelets: 270 10*3/uL (ref 140–400)
RBC: 5.21 10*6/uL — ABNORMAL HIGH (ref 3.80–5.10)
RDW: 12.5 % (ref 11.0–15.0)
Total Lymphocyte: 53.7 %
WBC: 3.2 10*3/uL — ABNORMAL LOW (ref 3.8–10.8)

## 2023-10-09 LAB — COMPLETE METABOLIC PANEL WITH GFR
AG Ratio: 1.2 (calc) (ref 1.0–2.5)
ALT: 15 U/L (ref 6–29)
AST: 19 U/L (ref 10–35)
Albumin: 4.3 g/dL (ref 3.6–5.1)
Alkaline phosphatase (APISO): 78 U/L (ref 37–153)
BUN/Creatinine Ratio: 16 (calc) (ref 6–22)
BUN: 18 mg/dL (ref 7–25)
CO2: 31 mmol/L (ref 20–32)
Calcium: 9.5 mg/dL (ref 8.6–10.4)
Chloride: 102 mmol/L (ref 98–110)
Creat: 1.15 mg/dL — ABNORMAL HIGH (ref 0.50–1.05)
Globulin: 3.5 g/dL (ref 1.9–3.7)
Glucose, Bld: 105 mg/dL — ABNORMAL HIGH (ref 65–99)
Potassium: 4.4 mmol/L (ref 3.5–5.3)
Sodium: 141 mmol/L (ref 135–146)
Total Bilirubin: 0.9 mg/dL (ref 0.2–1.2)
Total Protein: 7.8 g/dL (ref 6.1–8.1)
eGFR: 52 mL/min/{1.73_m2} — ABNORMAL LOW (ref >=60)

## 2023-10-09 LAB — HEMOGLOBIN A1C
Hgb A1c MFr Bld: 6.2 %{Hb} — ABNORMAL HIGH (ref <5.7)
Mean Plasma Glucose: 131 mg/dL
eAG (mmol/L): 7.3 mmol/L

## 2023-10-09 LAB — LIPID PANEL
Cholesterol: 182 mg/dL (ref <200)
HDL: 63 mg/dL (ref >=50)
LDL Cholesterol (Calc): 101 mg/dL — ABNORMAL HIGH
Non-HDL Cholesterol (Calc): 119 mg/dL (ref <130)
Total CHOL/HDL Ratio: 2.9 (calc) (ref <5.0)
Triglycerides: 85 mg/dL (ref <150)

## 2023-11-09 ENCOUNTER — Other Ambulatory Visit: Payer: Self-pay | Admitting: Nurse Practitioner

## 2023-11-09 ENCOUNTER — Ambulatory Visit: Payer: Medicare PPO

## 2023-11-09 ENCOUNTER — Ambulatory Visit
Admission: RE | Admit: 2023-11-09 | Discharge: 2023-11-09 | Disposition: A | Discharge status: Home / Self Care | Payer: Medicare PPO | Source: Ambulatory Visit | Attending: Nurse Practitioner | Admitting: Nurse Practitioner

## 2023-11-09 DIAGNOSIS — N644 Mastodynia: Secondary | ICD-10-CM

## 2023-11-23 ENCOUNTER — Other Ambulatory Visit: Payer: Self-pay | Admitting: Nurse Practitioner

## 2023-11-23 DIAGNOSIS — J45991 Cough variant asthma: Secondary | ICD-10-CM

## 2024-01-01 ENCOUNTER — Other Ambulatory Visit: Payer: Self-pay | Admitting: Nurse Practitioner

## 2024-01-01 DIAGNOSIS — I10 Essential (primary) hypertension: Secondary | ICD-10-CM

## 2024-01-01 DIAGNOSIS — I83893 Varicose veins of bilateral lower extremities with other complications: Secondary | ICD-10-CM

## 2024-01-19 ENCOUNTER — Other Ambulatory Visit: Payer: Self-pay | Admitting: Nurse Practitioner

## 2024-01-19 DIAGNOSIS — J45991 Cough variant asthma: Secondary | ICD-10-CM

## 2024-02-15 ENCOUNTER — Other Ambulatory Visit: Payer: Self-pay | Admitting: Nurse Practitioner

## 2024-02-15 DIAGNOSIS — J45991 Cough variant asthma: Secondary | ICD-10-CM

## 2024-02-21 ENCOUNTER — Other Ambulatory Visit: Payer: Self-pay | Admitting: Nurse Practitioner

## 2024-02-21 DIAGNOSIS — M1A0410 Idiopathic chronic gout, right hand, without tophus (tophi): Secondary | ICD-10-CM

## 2024-02-21 DIAGNOSIS — I10 Essential (primary) hypertension: Secondary | ICD-10-CM

## 2024-02-21 DIAGNOSIS — I83893 Varicose veins of bilateral lower extremities with other complications: Secondary | ICD-10-CM

## 2024-03-20 LAB — HM MAMMOGRAPHY
HM Mammogram: NORMAL
HM Mammogram: NORMAL (ref 0–4)

## 2024-04-10 ENCOUNTER — Encounter: Payer: Self-pay | Admitting: Nurse Practitioner

## 2024-04-14 ENCOUNTER — Ambulatory Visit: Payer: Medicare PPO | Admitting: Nurse Practitioner

## 2024-04-14 ENCOUNTER — Encounter: Payer: Self-pay | Admitting: Nurse Practitioner

## 2024-04-14 VITALS — BP 124/84 | HR 80 | Temp 97.9°F | Ht 69.0 in | Wt 336.0 lb

## 2024-04-14 DIAGNOSIS — R739 Hyperglycemia, unspecified: Secondary | ICD-10-CM

## 2024-04-14 DIAGNOSIS — I10 Essential (primary) hypertension: Secondary | ICD-10-CM | POA: Diagnosis not present

## 2024-04-14 DIAGNOSIS — E559 Vitamin D deficiency, unspecified: Secondary | ICD-10-CM

## 2024-04-14 DIAGNOSIS — M1711 Unilateral primary osteoarthritis, right knee: Secondary | ICD-10-CM

## 2024-04-14 DIAGNOSIS — M1A0410 Idiopathic chronic gout, right hand, without tophus (tophi): Secondary | ICD-10-CM

## 2024-04-14 DIAGNOSIS — R600 Localized edema: Secondary | ICD-10-CM

## 2024-04-14 DIAGNOSIS — J45991 Cough variant asthma: Secondary | ICD-10-CM

## 2024-04-14 DIAGNOSIS — E785 Hyperlipidemia, unspecified: Secondary | ICD-10-CM

## 2024-04-14 NOTE — Progress Notes (Unsigned)
 Careteam: Patient Care Team: [name], NP as PCP - General (Geriatric Medicine)  PLACE OF SERVICE:  [facility]  Advanced Directive information    Allergies  Allergen Reactions   Bactrim [Sulfamethoxazole-Trimethoprim]    Keflex [Cephalexin]    Nsaids Other (See Comments)    Told not to take due to kidney function   Omnicef [Cefdinir]    Sulfa Antibiotics     Chief Complaint  Patient presents with   Follow-up    6 month follow up    HPI:  Discussed the use of AI scribe software for clinical note transcription with the patient, who gave verbal consent to proceed.  History of Present Illness Traci Gomez is a 68 year old female with hypertension and borderline diabetes who presents for a six-month follow-up.  She has hypertension managed with Norvasc , metoprolol , and olmesartan . Additionally, she takes Lasix  1.5 tablets daily for fluid management, with no increase in leg swelling. Her blood pressure remains well controlled on this regimen.  She has a history of gout but has not experienced any recent flares. No symptoms suggestive of a gout flare are present.  She is borderline diabetic and is attempting weight loss through walking, increased water intake, and avoiding fried and fatty foods. Despite these efforts, she finds it challenging to lose weight consistently.  Her breathing is stable, though increased humidity poses a challenge. She has not needed to increase her inhaler use, maintaining her regular puffs.  She is on Protonix  for indigestion without issues. Her cholesterol levels were good at the last check.  She recently experienced a pulled thigh muscle and has been undergoing physical therapy twice a week for the past three weeks, with three more weeks approved by her insurance. The therapy has significantly improved her ability to walk.  She previously had breast pain, which was evaluated and determined to be non-cancerous. A recent mammogram and  Pap smear were normal. She attributes the breast pain to stress and caffeine intake, which she has since reduced, leading to symptom resolution.  She maintains an active lifestyle by accompanying her husband to his softball and golf games, although she does not participate in the sports herself.  ***  Review of Systems:  Review of Systems  Constitutional:  Negative for chills, fever and weight loss.  HENT:  Negative for tinnitus.   Respiratory:  Negative for cough, sputum production and shortness of breath.   Cardiovascular:  Negative for chest pain, palpitations and leg swelling.  Gastrointestinal:  Negative for abdominal pain, constipation, diarrhea and heartburn.  Genitourinary:  Negative for dysuria, frequency and urgency.  Musculoskeletal:  Negative for back pain, falls, joint pain and myalgias.  Skin: Negative.   Neurological:  Negative for dizziness and headaches.  Psychiatric/Behavioral:  Negative for depression and memory loss. The patient does not have insomnia.   ***  Past Medical History:  Diagnosis Date   Allergic rhinitis, unspecified    Arthritis    Per [facility] new patient packet    Asthma    Per [facility] new patient packet    Gout, unspecified    Hyperglycemia    Per labs completed on 03/15/2019 @ [facility]   Hyperlipidemia    Hypertension    Irregular bowel habits    Per [facility] new patient packet    Right knee injury    Stretched veins in right knee    Upper respiratory infection    Varicose vein of leg    Right, Per [facility] new  patient packet    Vitamin D  deficiency    Past Surgical History:  Procedure Laterality Date   TUBAL LIGATION  1990   [facility]    Social History:   reports that she has never smoked. She has never used smokeless tobacco. She reports that she does not currently use alcohol. She reports that she does not currently use drugs.  Family History  Problem Relation Age of Onset   High blood pressure Mother    Hypertension Mother    Heart  disease Father    COPD Father    Heart disease Brother     Medications: Patient's Medications  New Prescriptions   No medications on file  Previous Medications   ACETAMINOPHEN (TYLENOL) 500 MG TABLET    Take 500 mg by mouth as needed.   ALBUTEROL  (VENTOLIN  HFA) 108 (90 BASE) MCG/ACT INHALER    INHALE 2 PUFFS BY MOUTH EVERY 6 HOURS AS NEEDED FOR WHEEZING OR SHORTNESS OF BREATH   ALBUTEROL  SULFATE 2.5 MG/0.5ML NEBU    Inhale 0.5 mLs (2.5 mg total) into the lungs as needed.   ALLOPURINOL  (ZYLOPRIM ) 100 MG TABLET    TAKE 1 TABLET(100 MG) BY MOUTH DAILY   AMLODIPINE  (NORVASC ) 5 MG TABLET    TAKE 1 TABLET(5 MG) BY MOUTH DAILY   BUDESONIDE -FORMOTEROL  (SYMBICORT ) 80-4.5 MCG/ACT INHALER    INHALE 2 PUFFS INTO THE LUNGS TWICE DAILY   CHOLECALCIFEROL  (VITAMIN D3 PO)    Take 1 capsule by mouth daily.   FAMOTIDINE  (PEPCID ) 20 MG TABLET    TAKE 1 TABLET BY MOUTH EVERY DAY AFTER SUPPER   FUROSEMIDE  (LASIX ) 40 MG TABLET    TAKE 1 AND 1/2 TABLETS BY MOUTH DAILY   METOPROLOL  SUCCINATE (TOPROL -XL) 25 MG 24 HR TABLET    TAKE 1 TABLET(25 MG) BY MOUTH DAILY   MONTELUKAST  (SINGULAIR ) 10 MG TABLET    TAKE 1 TABLET(10 MG) BY MOUTH AT BEDTIME   NYSTATIN  (MYCOSTATIN /NYSTOP ) POWDER    Apply topically as needed.   NYSTATIN  CREAM (MYCOSTATIN )    Apply to skin twice daily as needed yeast   OLMESARTAN  (BENICAR ) 20 MG TABLET    TAKE 1 TABLET(20 MG) BY MOUTH DAILY   ONDANSETRON  (ZOFRAN -ODT) 4 MG DISINTEGRATING TABLET    Take 1 tablet (4 mg total) by mouth every 8 (eight) hours as needed.   PANTOPRAZOLE  (PROTONIX ) 40 MG TABLET    TAKE 1 TABLET(40 MG) BY MOUTH DAILY 30 TO 60 MINUTES BEFORE FIRST MEAL OF THE DAY   POTASSIUM CHLORIDE  (MICRO-K ) 10 MEQ CR CAPSULE    TAKE 1 CAPSULE(10 MEQ) BY MOUTH DAILY  Modified Medications   No medications on file  Discontinued Medications   No medications on file    Physical Exam:  Vitals:   04/14/24 0851  BP: 124/84  Pulse: 80  Temp: 97.9 F (36.6 C)  SpO2: 99%  Weight: (!)  336 lb (152.4 kg)  Height: 5' 9 (1.753 m)   Body mass index is 49.62 kg/m. Wt Readings from Last 3 Encounters:  04/14/24 (!) 336 lb (152.4 kg)  10/08/23 (!) 339 lb (153.8 kg)  09/20/23 (!) 345 lb (156.5 kg)    Physical Exam Constitutional:      General: She is not in acute distress.    Appearance: She is well-developed. She is not diaphoretic.  HENT:     Head: Normocephalic and atraumatic.     Mouth/Throat:     Pharynx: No oropharyngeal exudate.   Eyes:  Conjunctiva/sclera: Conjunctivae normal.     Pupils: Pupils are equal, round, and reactive to light.    Cardiovascular:     Rate and Rhythm: Normal rate and regular rhythm.     Heart sounds: Normal heart sounds.  Pulmonary:     Effort: Pulmonary effort is normal.     Breath sounds: Normal breath sounds.  Abdominal:     General: Bowel sounds are normal.     Palpations: Abdomen is soft.   Musculoskeletal:     Cervical back: Normal range of motion and neck supple.     Right lower leg: No edema.     Left lower leg: No edema.   Skin:    General: Skin is warm and dry.   Neurological:     Mental Status: She is alert.   Psychiatric:        Mood and Affect: Mood normal.   ***  Labs reviewed: Basic Metabolic Panel: Recent Labs    10/08/23 1052  NA 141  K 4.4  CL 102  CO2 31  GLUCOSE 105*  BUN 18  CREATININE 1.15*  CALCIUM 9.5   Liver Function Tests: Recent Labs    10/08/23 1052  AST 19  ALT 15  BILITOT 0.9  PROT 7.8   No results for input(s): LIPASE, AMYLASE in the last 8760 hours. No results for input(s): AMMONIA in the last 8760 hours. CBC: Recent Labs    10/08/23 1052  WBC 3.2*  NEUTROABS 1,050*  HGB 14.9  HCT 44.7  MCV 85.8  PLT 270   Lipid Panel: Recent Labs    10/08/23 1052  CHOL 182  HDL 63  LDLCALC 101*  TRIG 85  CHOLHDL 2.9   TSH: No results for input(s): TSH in the last 8760 hours. A1C: Lab Results  Component Value Date   HGBA1C 6.2 (H) 10/08/2023      Assessment/Plan *** There are no diagnoses linked to this encounter.   No follow-ups on file.: ***  Vihana Kydd K. Caro BODILY [facility] [phone]

## 2024-04-15 LAB — COMPREHENSIVE METABOLIC PANEL WITH GFR
AG Ratio: 1.4 (calc) (ref 1.0–2.5)
ALT: 12 U/L (ref 6–29)
AST: 14 U/L (ref 10–35)
Albumin: 4.1 g/dL (ref 3.6–5.1)
Alkaline phosphatase (APISO): 78 U/L (ref 37–153)
BUN/Creatinine Ratio: 19 (calc) (ref 6–22)
BUN: 23 mg/dL (ref 7–25)
CO2: 29 mmol/L (ref 20–32)
Calcium: 9.7 mg/dL (ref 8.6–10.4)
Chloride: 106 mmol/L (ref 98–110)
Creat: 1.21 mg/dL — ABNORMAL HIGH (ref 0.50–1.05)
Globulin: 2.9 g/dL (ref 1.9–3.7)
Glucose, Bld: 111 mg/dL — ABNORMAL HIGH (ref 65–99)
Potassium: 5.6 mmol/L — ABNORMAL HIGH (ref 3.5–5.3)
Sodium: 143 mmol/L (ref 135–146)
Total Bilirubin: 0.7 mg/dL (ref 0.2–1.2)
Total Protein: 7 g/dL (ref 6.1–8.1)
eGFR: 49 mL/min/{1.73_m2} — ABNORMAL LOW (ref >=60)

## 2024-04-15 LAB — CBC WITH DIFFERENTIAL/PLATELET
Absolute Lymphocytes: 1835 {cells}/uL (ref 850–3900)
Absolute Monocytes: 570 {cells}/uL (ref 200–950)
Basophils Absolute: 19 {cells}/uL (ref 0–200)
Basophils Relative: 0.5 %
Eosinophils Absolute: 91 {cells}/uL (ref 15–500)
Eosinophils Relative: 2.4 %
HCT: 43.8 % (ref 35.0–45.0)
Hemoglobin: 13.9 g/dL (ref 11.7–15.5)
MCH: 27.9 pg (ref 27.0–33.0)
MCHC: 31.7 g/dL — ABNORMAL LOW (ref 32.0–36.0)
MCV: 87.8 fL (ref 80.0–100.0)
MPV: 10.9 fL (ref 7.5–12.5)
Monocytes Relative: 15 %
Neutro Abs: 1284 {cells}/uL — ABNORMAL LOW (ref 1500–7800)
Neutrophils Relative %: 33.8 %
Platelets: 232 10*3/uL (ref 140–400)
RBC: 4.99 10*6/uL (ref 3.80–5.10)
RDW: 13.1 % (ref 11.0–15.0)
Total Lymphocyte: 48.3 %
WBC: 3.8 10*3/uL (ref 3.8–10.8)

## 2024-04-15 LAB — URIC ACID: Uric Acid, Serum: 6.1 mg/dL (ref 2.5–7.0)

## 2024-04-15 LAB — HEMOGLOBIN A1C
Hgb A1c MFr Bld: 6.2 % — ABNORMAL HIGH (ref <5.7)
Mean Plasma Glucose: 131 mg/dL
eAG (mmol/L): 7.3 mmol/L

## 2024-04-17 ENCOUNTER — Ambulatory Visit: Payer: Self-pay | Admitting: Nurse Practitioner

## 2024-04-19 DIAGNOSIS — M1711 Unilateral primary osteoarthritis, right knee: Secondary | ICD-10-CM | POA: Insufficient documentation

## 2024-04-19 DIAGNOSIS — R600 Localized edema: Secondary | ICD-10-CM | POA: Insufficient documentation

## 2024-04-19 NOTE — Assessment & Plan Note (Signed)
 Ongoing and stable at this time.

## 2024-04-19 NOTE — Assessment & Plan Note (Signed)
 Blood pressure well controlled, goal bp <140/90 Continue current medications and dietary modifications follow metabolic panel

## 2024-04-19 NOTE — Assessment & Plan Note (Signed)
 Not currently on medication Continue with diet modifications

## 2024-04-19 NOTE — Assessment & Plan Note (Signed)
 Stable without worsening of symptoms or recent flare, continues on Symbicort  with singulair .

## 2024-04-19 NOTE — Assessment & Plan Note (Signed)
-  education provided on healthy weight loss through increase in physical activity and proper nutrition

## 2024-04-19 NOTE — Assessment & Plan Note (Signed)
-  stable at this time, continues on lasix , encouraged to elevate legs above level of heart as tolerates, low sodium diet, compression hose as tolerates (on in am, off in pm)

## 2024-04-19 NOTE — Assessment & Plan Note (Signed)
Continues on supplement 

## 2024-04-19 NOTE — Assessment & Plan Note (Signed)
 No recent flares, continues on allopurinol .

## 2024-04-19 NOTE — Assessment & Plan Note (Signed)
 Continue dietary modifications

## 2024-05-01 ENCOUNTER — Other Ambulatory Visit

## 2024-05-05 ENCOUNTER — Encounter: Payer: Self-pay | Admitting: Nurse Practitioner

## 2024-05-08 ENCOUNTER — Other Ambulatory Visit: Payer: Self-pay | Admitting: Nurse Practitioner

## 2024-05-08 DIAGNOSIS — I10 Essential (primary) hypertension: Secondary | ICD-10-CM

## 2024-05-08 MED ORDER — NYSTATIN 100000 UNIT/GM EX POWD: CUTANEOUS | 1 refills | Status: AC | PRN

## 2024-05-09 ENCOUNTER — Telehealth: Payer: Self-pay

## 2024-05-09 NOTE — Telephone Encounter (Signed)
Yeast dermatitis

## 2024-05-09 NOTE — Telephone Encounter (Signed)
 Diagnosis was added to history and I was able to resume PA.      Awaiting to hear from insurance company

## 2024-05-09 NOTE — Telephone Encounter (Signed)
   I called patients pharmacy and left a detailed voicemail notifying them of prior authorization approval.

## 2024-05-09 NOTE — Telephone Encounter (Signed)
 Incoming fax received from patients pharmacy to initiate a prior authorization for                   .  PA initiated through covermymeds. Key: [id number]  Unable to complete prior authorization due to needed a diagnosis for RX: Nystatin  Powder  Please advise

## 2024-05-15 ENCOUNTER — Other Ambulatory Visit

## 2024-05-15 DIAGNOSIS — I10 Essential (primary) hypertension: Secondary | ICD-10-CM

## 2024-05-15 LAB — BASIC METABOLIC PANEL WITH GFR
BUN/Creatinine Ratio: 16 (calc) (ref 6–22)
BUN: 17 mg/dL (ref 7–25)
CO2: 29 mmol/L (ref 20–32)
Calcium: 8.9 mg/dL (ref 8.6–10.4)
Chloride: 106 mmol/L (ref 98–110)
Creat: 1.07 mg/dL — ABNORMAL HIGH (ref 0.50–1.05)
Glucose, Bld: 104 mg/dL — ABNORMAL HIGH (ref 65–99)
Potassium: 4.2 mmol/L (ref 3.5–5.3)
Sodium: 144 mmol/L (ref 135–146)
eGFR: 57 mL/min/1.73m2 — ABNORMAL LOW (ref >=60)

## 2024-05-16 ENCOUNTER — Ambulatory Visit: Payer: Self-pay | Admitting: Nurse Practitioner

## 2024-05-22 ENCOUNTER — Other Ambulatory Visit: Payer: Self-pay | Admitting: Nurse Practitioner

## 2024-05-22 DIAGNOSIS — J45991 Cough variant asthma: Secondary | ICD-10-CM

## 2024-06-01 ENCOUNTER — Other Ambulatory Visit: Payer: Self-pay | Admitting: Nurse Practitioner

## 2024-06-01 DIAGNOSIS — J45991 Cough variant asthma: Secondary | ICD-10-CM

## 2024-06-20 ENCOUNTER — Emergency Department (HOSPITAL_COMMUNITY)

## 2024-06-20 ENCOUNTER — Encounter (HOSPITAL_COMMUNITY): Payer: Self-pay

## 2024-06-20 ENCOUNTER — Ambulatory Visit
Admission: EM | Admit: 2024-06-20 | Discharge: 2024-06-20 | Disposition: A | Discharge status: Home / Self Care | Attending: Emergency Medicine | Admitting: Emergency Medicine

## 2024-06-20 ENCOUNTER — Emergency Department (HOSPITAL_COMMUNITY)
Admission: EM | Admit: 2024-06-20 | Discharge: 2024-06-20 | Disposition: A | Discharge status: Home / Self Care | Source: Ambulatory Visit | Attending: Emergency Medicine | Admitting: Emergency Medicine

## 2024-06-20 ENCOUNTER — Other Ambulatory Visit: Payer: Self-pay

## 2024-06-20 DIAGNOSIS — R079 Chest pain, unspecified: Secondary | ICD-10-CM

## 2024-06-20 DIAGNOSIS — R030 Elevated blood-pressure reading, without diagnosis of hypertension: Secondary | ICD-10-CM | POA: Diagnosis not present

## 2024-06-20 DIAGNOSIS — Z79899 Other long term (current) drug therapy: Secondary | ICD-10-CM | POA: Insufficient documentation

## 2024-06-20 DIAGNOSIS — R0789 Other chest pain: Secondary | ICD-10-CM | POA: Insufficient documentation

## 2024-06-20 LAB — CBC
HCT: 46.3 % — ABNORMAL HIGH (ref 36.0–46.0)
Hemoglobin: 14.7 g/dL (ref 12.0–15.0)
MCH: 27.8 pg (ref 26.0–34.0)
MCHC: 31.7 g/dL (ref 30.0–36.0)
MCV: 87.5 fL (ref 80.0–100.0)
Platelets: 237 K/uL (ref 150–400)
RBC: 5.29 MIL/uL — ABNORMAL HIGH (ref 3.87–5.11)
RDW: 13.7 % (ref 11.5–15.5)
WBC: 4 K/uL (ref 4.0–10.5)
nRBC: 0 % (ref 0.0–0.2)

## 2024-06-20 LAB — BASIC METABOLIC PANEL WITH GFR
Anion gap: 13 (ref 5–15)
BUN: 14 mg/dL (ref 8–23)
CO2: 26 mmol/L (ref 22–32)
Calcium: 9.6 mg/dL (ref 8.9–10.3)
Chloride: 102 mmol/L (ref 98–111)
Creatinine, Ser: 0.96 mg/dL (ref 0.44–1.00)
GFR, Estimated: >60 mL/min (ref >60)
Glucose, Bld: 105 mg/dL — ABNORMAL HIGH (ref 70–99)
Potassium: 4.1 mmol/L (ref 3.5–5.1)
Sodium: 140 mmol/L (ref 135–145)

## 2024-06-20 LAB — TROPONIN T, HIGH SENSITIVITY: Troponin T High Sensitivity: <15 ng/L (ref 0–19)

## 2024-06-20 NOTE — ED Provider Notes (Signed)
 [place] EMERGENCY DEPARTMENT AT [facility] Provider Note   CSN: [id number] Arrival date & time: 06/20/24  9144     Patient presents with: Chest Pain   Traci Gomez is a 68 y.o. female.   Patient states she was watching football yesterday when she noticed a sharp left-sided chest pain.  States it started throbbing later.  Decided to sleep on it and see how she felt the morning.  States she noticed the pain radiating up the left side of her neck and shoulder in the morning.  Daughter urged her to be seen in urgent care.  Had EKG there which looked normal, urgent care provider recommended emergency room evaluation.  The patient states her chest pain was still present but rates it as a 2/10 versus a 4/10 earlier today.  Denies shortness of breath, headache, dizziness, confusion, nausea/vomiting.  Has been eating and drinking normally.  Has not taken home meds today, states her blood pressure has been well-controlled per her PCP.  Denies history of acute cardiac events.  Patient is visiting from [place] .   Chest Pain      Prior to Admission medications   Medication Sig Start Date End Date Taking? Authorizing Provider  acetaminophen (TYLENOL) 500 MG tablet Take 500 mg by mouth as needed.    [provider]  albuterol  (VENTOLIN  HFA) 108 (90 Base) MCG/ACT inhaler INHALE 2 PUFFS BY MOUTH EVERY 6 HOURS AS NEEDED FOR WHEEZING OR SHORTNESS OF BREATH 06/11/22   [name], NP  Albuterol  Sulfate 2.5 MG/0.5ML NEBU Inhale 0.5 mLs (2.5 mg total) into the lungs as needed. 04/16/20   Eubanks, Jessica K, NP  allopurinol  (ZYLOPRIM ) 100 MG tablet TAKE 1 TABLET(100 MG) BY MOUTH DAILY 02/21/24   Eubanks, Jessica K, NP  amLODipine  (NORVASC ) 5 MG tablet TAKE 1 TABLET(5 MG) BY MOUTH DAILY 10/06/23   [name], NP  budesonide -formoterol  (SYMBICORT ) 80-4.5 MCG/ACT inhaler INHALE 2 PUFFS INTO THE LUNGS TWICE DAILY 02/15/24   Eubanks, Jessica K, NP  Cholecalciferol  (VITAMIN D3  PO) Take 1 capsule by mouth daily.    [provider]  famotidine  (PEPCID ) 20 MG tablet TAKE 1 TABLET BY MOUTH EVERY DAY AFTER SUPPER 05/22/24   Eubanks, Jessica K, NP  furosemide  (LASIX ) 40 MG tablet TAKE 1 AND 1/2 TABLETS BY MOUTH DAILY 01/03/24   Eubanks, Jessica K, NP  metoprolol  succinate (TOPROL -XL) 25 MG 24 hr tablet TAKE 1 TABLET(25 MG) BY MOUTH DAILY 01/03/24   Eubanks, Jessica K, NP  montelukast  (SINGULAIR ) 10 MG tablet TAKE 1 TABLET(10 MG) BY MOUTH AT BEDTIME 05/22/24   Eubanks, Jessica K, NP  nystatin  (MYCOSTATIN /NYSTOP ) powder Apply topically as needed. 05/08/24   Eubanks, Jessica K, NP  nystatin  cream (MYCOSTATIN ) Apply to skin twice daily as needed yeast 05/31/23   Eubanks, Jessica K, NP  olmesartan  (BENICAR ) 20 MG tablet TAKE 1 TABLET(20 MG) BY MOUTH DAILY 01/03/24   Eubanks, Jessica K, NP  ondansetron  (ZOFRAN -ODT) 4 MG disintegrating tablet Take 1 tablet (4 mg total) by mouth every 8 (eight) hours as needed. 06/20/23   Billy Asberry FALCON, PA-C  pantoprazole  (PROTONIX ) 40 MG tablet TAKE 1 TABLET(40 MG) BY MOUTH DAILY 30 TO 60 MINUTES BEFORE FIRST MEAL OF THE DAY 06/01/24   [name], NP  potassium chloride  (MICRO-K ) 10 MEQ CR capsule TAKE 1 CAPSULE(10 MEQ) BY MOUTH DAILY 02/21/24   Eubanks, Jessica K, NP    Allergies: Bactrim [sulfamethoxazole-trimethoprim], Keflex [cephalexin], Nsaids, Omnicef [cefdinir], and Sulfa antibiotics  Review of Systems  Cardiovascular:  Positive for chest pain.    Updated Vital Signs BP (!) 191/119 (BP Location: Left Arm)   Pulse 67   Temp 97.9 F (36.6 C) (Oral)   Resp 16   Ht 5' 9 (1.753 m)   Wt (!) 152.4 kg   LMP  (Exact Date)   SpO2 100%   BMI 49.62 kg/m   Physical Exam Constitutional:      General: She is not in acute distress.    Appearance: She is well-developed.  HENT:     Head: Normocephalic and atraumatic.  Cardiovascular:     Rate and Rhythm: Normal rate and regular rhythm.     Heart sounds: Normal heart sounds.   Pulmonary:     Effort: Pulmonary effort is normal.     Breath sounds: Normal breath sounds.  Chest:     Chest wall: Tenderness present.     Comments: Tender to palpation at left upper chest Abdominal:     General: Bowel sounds are normal.     Palpations: Abdomen is soft.     Tenderness: There is no abdominal tenderness.  Skin:    General: Skin is warm and dry.  Neurological:     General: No focal deficit present.     Mental Status: She is alert.  Psychiatric:        Mood and Affect: Mood normal.        Behavior: Behavior normal.     (all labs ordered are listed, but only abnormal results are displayed) Labs Reviewed  BASIC METABOLIC PANEL WITH GFR  CBC  TROPONIN T, HIGH SENSITIVITY    EKG: None  Radiology: No results found.   Procedures   Medications Ordered in the ED - No data to display                                  Medical Decision Making Patient with <1 day of left-sided chest pain with radiation to L neck/arm.  Reassuringly normal EKG at urgent care and here.  Normal chest x-ray.  Troponins <15, CBC and BMP WNL.  Given normal workup and reproducible L chest pain on palpation, symptoms are likely MSK in etiology.  Also with elevated blood pressure in the setting of not having taken meds this morning. Instructed patient to patient take home meds while in ED with improvement of blood pressure to 171/78.  Patient comfortable with discharge home with PCP follow-up.       Final diagnoses:  None    ED Discharge Orders     None          Adele Song, MD 06/20/24 1213    Garrick Charleston, MD 06/21/24 [phone]

## 2024-06-20 NOTE — Discharge Instructions (Signed)
 We saw you in the emergency room due to left-sided chest pain.  Your workup was negative for a cardiac cause.  We recommend following up with your PCP as needed.

## 2024-06-20 NOTE — ED Notes (Signed)
 Patient is being discharged from the Urgent Care and sent to the Emergency Department via private vehicle  . Per R. Rising PA, patient is in need of higher level of care due to chest pain. Patient is aware and verbalizes understanding of plan of care.  Vitals:   06/20/24 0818 06/20/24 0821  BP:  (!) 165/68  Pulse: 65   Resp: 16   Temp: 97.7 F (36.5 C)   SpO2: 97%

## 2024-06-20 NOTE — ED Triage Notes (Signed)
 Pt states left sided CP since last night.  States it radiates up to her left neck.  States she hasn't taken anything at home for it.

## 2024-06-20 NOTE — ED Provider Notes (Signed)
 [facility]    CSN: [id number] Arrival date & time: 06/20/24  0807     History   Chief Complaint Chief Complaint  Patient presents with   Chest Pain    HPI Traci Gomez is a 68 y.o. female.  Presents with left-sided chest pain that started last night Currently rating 4/10 discomfort It radiates up to her left neck and jaw  She denies any shortness of breath, nausea, vomiting, weakness  History of hypertension, hyperlipidemia  Past Medical History:  Diagnosis Date   Allergic rhinitis, unspecified    Arthritis    Per [facility] new patient packet    Asthma    Per [facility] new patient packet    Gout, unspecified    Hyperglycemia    Per labs completed on 03/15/2019 @ [facility]   Hyperlipidemia    Hypertension    Irregular bowel habits    Per [facility] new patient packet    Right knee injury    Stretched veins in right knee    Upper respiratory infection    Varicose vein of leg    Right, Per [facility] new patient packet    Vitamin D  deficiency    Yeast dermatitis     Patient Active Problem List   Diagnosis Date Noted   Lower extremity edema 04/19/2024   Primary osteoarthritis of right knee 04/19/2024   Obesity, morbid (more than 100 lbs over ideal weight or BMI > 40) (HCC) 06/03/2019   Vitamin D  deficiency    Gout, unspecified    Hyperglycemia    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      Home Medications    Prior to Admission medications   Medication Sig Start Date End Date Taking? Authorizing Provider  acetaminophen (TYLENOL) 500 MG tablet Take 500 mg by mouth as needed.    [provider]  albuterol  (VENTOLIN  HFA) 108 (90 Base) MCG/ACT inhaler INHALE 2 PUFFS BY MOUTH EVERY 6 HOURS AS NEEDED FOR WHEEZING OR SHORTNESS OF BREATH 06/11/22   [name], NP  Albuterol  Sulfate 2.5 MG/0.5ML NEBU Inhale 0.5 mLs (2.5 mg total) into the lungs as needed. 04/16/20   Eubanks, Jessica K, NP  allopurinol  (ZYLOPRIM ) 100 MG tablet TAKE 1 TABLET(100 MG) BY MOUTH DAILY 02/21/24   Eubanks, Jessica K, NP  amLODipine  (NORVASC ) 5 MG tablet TAKE 1 TABLET(5 MG) BY MOUTH DAILY 10/06/23   [name], NP  budesonide -formoterol  (SYMBICORT ) 80-4.5 MCG/ACT inhaler INHALE 2 PUFFS INTO THE LUNGS TWICE DAILY 02/15/24   Eubanks, Jessica K, NP  Cholecalciferol  (VITAMIN D3 PO) Take 1 capsule by mouth daily.    [provider]  famotidine  (PEPCID ) 20 MG tablet TAKE 1 TABLET BY MOUTH EVERY DAY AFTER SUPPER 05/22/24   Eubanks, Jessica K, NP  furosemide  (LASIX ) 40 MG tablet TAKE 1 AND 1/2 TABLETS BY MOUTH DAILY 01/03/24   Eubanks, Jessica K, NP  metoprolol  succinate (TOPROL -XL) 25 MG 24 hr tablet TAKE 1 TABLET(25 MG) BY MOUTH DAILY 01/03/24   Eubanks, Jessica K, NP  montelukast  (SINGULAIR ) 10 MG tablet TAKE 1 TABLET(10 MG) BY MOUTH AT BEDTIME 05/22/24   Eubanks, Jessica K, NP  nystatin  (MYCOSTATIN /NYSTOP ) powder Apply topically as needed. 05/08/24   Caro,  Jessica K, NP  nystatin  cream (MYCOSTATIN ) Apply to skin twice daily as needed yeast 05/31/23   Eubanks, Jessica K, NP  olmesartan  (BENICAR ) 20 MG tablet TAKE 1 TABLET(20 MG) BY MOUTH DAILY 01/03/24   Eubanks, Jessica K, NP  ondansetron  (ZOFRAN -ODT) 4 MG disintegrating tablet Take 1 tablet (4 mg total) by mouth every 8 (eight) hours as needed. 06/20/23   Billy Asberry FALCON, PA-C  pantoprazole  (PROTONIX ) 40 MG tablet TAKE 1 TABLET(40 MG) BY MOUTH DAILY 30 TO 60 MINUTES BEFORE FIRST MEAL OF THE DAY 06/01/24   [name], NP  potassium chloride  (MICRO-K ) 10 MEQ CR capsule TAKE 1 CAPSULE(10 MEQ) BY MOUTH DAILY 02/21/24   [name], NP    Family History Family History  Problem Relation Age of Onset   High blood pressure Mother    Hypertension Mother    Heart disease Father    COPD Father    Heart  disease Brother     Social History Social History   Tobacco Use   Smoking status: Never   Smokeless tobacco: Never  Vaping Use   Vaping status: Never Used  Substance Use Topics   Alcohol use: Not Currently   Drug use: Not Currently     Allergies   Bactrim [sulfamethoxazole-trimethoprim], Keflex [cephalexin], Nsaids, Omnicef [cefdinir], and Sulfa antibiotics   Review of Systems Review of Systems  Cardiovascular:  Positive for chest pain.     Physical Exam Triage Vital Signs ED Triage Vitals  Encounter Vitals Group     BP 06/20/24 0821 (!) 165/68     Girls Systolic BP Percentile --      Girls Diastolic BP Percentile --      Boys Systolic BP Percentile --      Boys Diastolic BP Percentile --      Pulse Rate 06/20/24 0818 65     Resp 06/20/24 0818 16     Temp 06/20/24 0818 97.7 F (36.5 C)     Temp Source 06/20/24 0818 Oral     SpO2 06/20/24 0818 97 %     Weight --      Height --      Head Circumference --      Peak Flow --      Pain Score 06/20/24 0820 4     Pain Loc --      Pain Education --      Exclude from Growth Chart --    No data found.  Updated Vital Signs BP (!) 165/68 (BP Location: Left Arm)   Pulse 65   Temp 97.7 F (36.5 C) (Oral)   Resp 16   SpO2 97%   Physical Exam Vitals and nursing note reviewed.  Constitutional:      General: She is not in acute distress.    Appearance: Normal appearance.  HENT:     Mouth/Throat:     Pharynx: Oropharynx is clear.  Cardiovascular:     Rate and Rhythm: Normal rate and regular rhythm.     Pulses: Normal pulses.          Radial pulses are 2+ on the right side and 2+ on the left side.     Heart sounds: Normal heart sounds.  Pulmonary:     Effort: Pulmonary effort is normal. No respiratory distress.     Breath sounds: Normal breath sounds.  Abdominal:     Palpations: Abdomen is soft.     Tenderness: There is no abdominal tenderness.  Musculoskeletal:  Cervical back: Normal range of motion. No  rigidity.  Skin:    General: Skin is warm and dry.  Neurological:     Mental Status: She is alert and oriented to person, place, and time.     UC Treatments / Results  Labs (all labs ordered are listed, but only abnormal results are displayed) Labs Reviewed - No data to display  EKG  Radiology No results found.  Procedures Procedures   Medications Ordered in UC Medications - No data to display  Initial Impression / Assessment and Plan / UC Course  I have reviewed the triage vital signs and the nursing notes.  Pertinent labs & imaging results that were available during my care of the patient were reviewed by me and considered in my medical decision making (see chart for details).  Elevated BP 165/68  EKG normal sinus with ventricular rate of 65 bpm No obvious ST or T wave changes.  Compared with prior reading from September 2024  With chest pain onset last night, radiating to left neck and jaw, patient requires higher level of care in the emergency department.  Needs troponins to rule out ischemia. Discussed with patient and daughter who is bedside. Daughter comfortable transporting her to the nearest ED via POV  Final Clinical Impressions(s) / UC Diagnoses   Final diagnoses:  Chest pain, unspecified type   Discharge Instructions   None    ED Prescriptions   None    PDMP not reviewed this encounter.   Wanetta Funderburke, Asberry, PA-C 06/20/24 0900

## 2024-06-20 NOTE — ED Triage Notes (Signed)
 Pt from UC C/O CP radiating to L neck since yesterday. Denies SOB, n/v.

## 2024-08-25 ENCOUNTER — Other Ambulatory Visit: Payer: Self-pay | Admitting: Nurse Practitioner

## 2024-08-25 DIAGNOSIS — M1A0410 Idiopathic chronic gout, right hand, without tophus (tophi): Secondary | ICD-10-CM

## 2024-09-22 ENCOUNTER — Encounter: Payer: Medicare PPO | Admitting: Nurse Practitioner

## 2024-09-25 ENCOUNTER — Encounter: Payer: Medicare PPO | Admitting: Nurse Practitioner

## 2024-09-28 ENCOUNTER — Other Ambulatory Visit: Payer: Self-pay | Admitting: Nurse Practitioner

## 2024-09-28 DIAGNOSIS — I10 Essential (primary) hypertension: Secondary | ICD-10-CM

## 2024-10-09 ENCOUNTER — Encounter: Payer: Self-pay | Admitting: Nurse Practitioner

## 2024-10-16 ENCOUNTER — Encounter: Payer: Self-pay | Admitting: Nurse Practitioner

## 2024-10-16 ENCOUNTER — Ambulatory Visit: Payer: Self-pay | Admitting: Nurse Practitioner

## 2024-10-16 VITALS — BP 144/92 | HR 96 | Temp 98.1°F | Ht 69.0 in | Wt 317.0 lb

## 2024-10-16 DIAGNOSIS — J45991 Cough variant asthma: Secondary | ICD-10-CM | POA: Diagnosis not present

## 2024-10-16 DIAGNOSIS — K219 Gastro-esophageal reflux disease without esophagitis: Secondary | ICD-10-CM | POA: Diagnosis not present

## 2024-10-16 DIAGNOSIS — E785 Hyperlipidemia, unspecified: Secondary | ICD-10-CM | POA: Diagnosis not present

## 2024-10-16 DIAGNOSIS — I10 Essential (primary) hypertension: Secondary | ICD-10-CM | POA: Diagnosis not present

## 2024-10-16 DIAGNOSIS — E559 Vitamin D deficiency, unspecified: Secondary | ICD-10-CM

## 2024-10-16 DIAGNOSIS — R739 Hyperglycemia, unspecified: Secondary | ICD-10-CM | POA: Diagnosis not present

## 2024-10-16 DIAGNOSIS — M1A0410 Idiopathic chronic gout, right hand, without tophus (tophi): Secondary | ICD-10-CM

## 2024-10-16 NOTE — Progress Notes (Signed)
 "   Careteam: Patient Care Team: Traci Harlene POUR, NP as PCP - General (Geriatric Medicine)  PLACE OF SERVICE:  [facility]  Advanced Directive information    Allergies[1]  Chief Complaint  Patient presents with   medication management of chronic issues    Patient presents today for routine 6 month follow up. No care gaps to discuss today. Patient mother just passed on 2024/11/26.     HPI:  Discussed the use of AI scribe software for clinical note transcription with the patient, who gave verbal consent to proceed.  History of Present Illness Traci Gomez is a 68 year old female with hypertension and hyperlipidemia who presents for a six-month follow-up visit.  She did not take her blood pressure medication this morning and attributes her elevated blood pressure to this. She is currently on metoprolol  25 mg daily, amlodipine  5 mg daily, Benicar  20 mg daily, and Lasix . She experiences frequent urination throughout the day when taking Lasix  so did not take prior to coming to appt.  She has a history of hyperlipidemia. Not currently on medication  She is on allopurinol  for gout and reports no recent flares.   Her indigestion is controlled with Protonix .  She has cough variant asthma and is taking Singulair  10 mg daily and Symbicort  two puffs twice daily, with no increase in cough or breathing issues reported.  She has a history of vitamin D  deficiency and is on a supplement.  She stopped potassium supplementation due to previously elevated potassium levels.  Her mother recently passed away so she is grieving at this time. Has a good support system   Review of Systems:  Review of Systems  Constitutional:  Negative for chills, fever and weight loss.  HENT:  Negative for tinnitus.   Respiratory:  Negative for cough, sputum production and shortness of breath.   Cardiovascular:  Negative for chest pain, palpitations and leg swelling.  Gastrointestinal:  Negative for abdominal pain,  constipation, diarrhea and heartburn.  Genitourinary:  Negative for dysuria, frequency and urgency.  Musculoskeletal:  Negative for back pain, falls, joint pain and myalgias.  Skin: Negative.   Neurological:  Negative for dizziness and headaches.  Psychiatric/Behavioral:  Negative for depression and memory loss. The patient does not have insomnia.     Past Medical History:  Diagnosis Date   Allergic rhinitis, unspecified    Arthritis    Per [facility] new patient packet    Asthma    Per [facility] new patient packet    Gout, unspecified    Hyperglycemia    Per labs completed on 03/15/2019 @ [facility]   Hyperlipidemia    Hypertension    Irregular bowel habits    Per [facility] new patient packet    Right knee injury    Stretched veins in right knee    Upper respiratory infection    Varicose vein of leg    Right, Per [facility] new patient packet    Vitamin D  deficiency    Yeast dermatitis    Past Surgical History:  Procedure Laterality Date   TUBAL LIGATION  1990   [facility]    Social History:   reports that she has never smoked. She has never used smokeless tobacco. She reports that she does not currently use alcohol. She reports that she does not currently use drugs.  Family History  Problem Relation Age of Onset   High blood pressure Mother    Hypertension Mother    Heart disease Father  COPD Father    Heart disease Brother     Medications: Patient's Medications  New Prescriptions   No medications on file  Previous Medications   ACETAMINOPHEN (TYLENOL) 500 MG TABLET    Take 500 mg by mouth as needed.   ALBUTEROL  (VENTOLIN  HFA) 108 (90 BASE) MCG/ACT INHALER    INHALE 2 PUFFS BY MOUTH EVERY 6 HOURS AS NEEDED FOR WHEEZING OR SHORTNESS OF BREATH   ALBUTEROL  SULFATE 2.5 MG/0.5ML NEBU    Inhale 0.5 mLs (2.5 mg total) into the lungs as needed.   ALLOPURINOL  (ZYLOPRIM ) 100 MG TABLET    TAKE 1 TABLET(100 MG) BY MOUTH DAILY   AMLODIPINE  (NORVASC ) 5 MG TABLET    TAKE 1 TABLET(5 MG) BY  MOUTH DAILY   BUDESONIDE -FORMOTEROL  (SYMBICORT ) 80-4.5 MCG/ACT INHALER    INHALE 2 PUFFS INTO THE LUNGS TWICE DAILY   CHOLECALCIFEROL  (VITAMIN D3 PO)    Take 1 capsule by mouth daily.   FAMOTIDINE  (PEPCID ) 20 MG TABLET    TAKE 1 TABLET BY MOUTH EVERY DAY AFTER SUPPER   FUROSEMIDE  (LASIX ) 40 MG TABLET    TAKE 1 AND 1/2 TABLETS BY MOUTH DAILY   METOPROLOL  SUCCINATE (TOPROL -XL) 25 MG 24 HR TABLET    TAKE 1 TABLET(25 MG) BY MOUTH DAILY   MONTELUKAST  (SINGULAIR ) 10 MG TABLET    TAKE 1 TABLET(10 MG) BY MOUTH AT BEDTIME   NYSTATIN  (MYCOSTATIN /NYSTOP ) POWDER    Apply topically as needed.   NYSTATIN  CREAM (MYCOSTATIN )    Apply to skin twice daily as needed yeast   OLMESARTAN  (BENICAR ) 20 MG TABLET    TAKE 1 TABLET(20 MG) BY MOUTH DAILY   ONDANSETRON  (ZOFRAN -ODT) 4 MG DISINTEGRATING TABLET    Take 1 tablet (4 mg total) by mouth every 8 (eight) hours as needed.   PANTOPRAZOLE  (PROTONIX ) 40 MG TABLET    TAKE 1 TABLET(40 MG) BY MOUTH DAILY 30 TO 60 MINUTES BEFORE FIRST MEAL OF THE DAY   POTASSIUM CHLORIDE  (MICRO-K ) 10 MEQ CR CAPSULE    TAKE 1 CAPSULE(10 MEQ) BY MOUTH DAILY  Modified Medications   No medications on file  Discontinued Medications   No medications on file    Physical Exam:  Vitals:   10/13/24 1332 10/16/24 0907  BP: (!) 140/86 (!) 144/92  Pulse: 96   Temp: 98.1 F (36.7 C)   SpO2: 98%   Weight: (!) 317 lb (143.8 kg)   Height: 5' 9 (1.753 m)    Body mass index is 46.81 kg/m. Wt Readings from Last 3 Encounters:  10/13/24 (!) 317 lb (143.8 kg)  06/20/24 (!) 336 lb (152.4 kg)  04/14/24 (!) 336 lb (152.4 kg)    Physical Exam Constitutional:      General: She is not in acute distress.    Appearance: She is well-developed. She is not diaphoretic.  HENT:     Head: Normocephalic and atraumatic.     Mouth/Throat:     Pharynx: No oropharyngeal exudate.  Eyes:     Conjunctiva/sclera: Conjunctivae normal.     Pupils: Pupils are equal, round, and reactive to light.   Cardiovascular:     Rate and Rhythm: Normal rate and regular rhythm.     Heart sounds: Normal heart sounds.  Pulmonary:     Effort: Pulmonary effort is normal.     Breath sounds: Normal breath sounds.  Abdominal:     General: Bowel sounds are normal.     Palpations: Abdomen is soft.  Musculoskeletal:     Cervical back:  Normal range of motion and neck supple.     Right lower leg: No edema.     Left lower leg: No edema.  Skin:    General: Skin is warm and dry.  Neurological:     Mental Status: She is alert.  Psychiatric:        Mood and Affect: Mood normal.     Labs reviewed: Basic Metabolic Panel: Recent Labs    04/14/24 0932 05/15/24 1028 06/20/24 1005  NA 143 144 140  K 5.6* 4.2 4.1  CL 106 106 102  CO2 29 29 26   GLUCOSE 111* 104* 105*  BUN 23 17 14   CREATININE 1.21* 1.07* 0.96  CALCIUM 9.7 8.9 9.6   Liver Function Tests: Recent Labs    04/14/24 0932  AST 14  ALT 12  BILITOT 0.7  PROT 7.0   No results for input(s): LIPASE, AMYLASE in the last 8760 hours. No results for input(s): AMMONIA in the last 8760 hours. CBC: Recent Labs    04/14/24 0932 06/20/24 1005  WBC 3.8 4.0  NEUTROABS 1,284*  --   HGB 13.9 14.7  HCT 43.8 46.3*  MCV 87.8 87.5  PLT 232 237   Lipid Panel: No results for input(s): CHOL, HDL, LDLCALC, TRIG, CHOLHDL, LDLDIRECT in the last 8760 hours. TSH: No results for input(s): TSH in the last 8760 hours. A1C: Lab Results  Component Value Date   HGBA1C 6.2 (H) 04/14/2024     Assessment/Plan Assessment and Plan Assessment & Plan Essential hypertension Blood pressure elevated due to missed antihypertensive doses. - Continue metoprolol  25 mg daily. - Continue amlodipine  5 mg daily. - Continue Lasix  as prescribed. - Monitor blood pressure regularly.  Hyperlipidemia Due for cholesterol check. Continue dietary modifications - Ordered cholesterol panel.  Chronic gout of right hand On allopurinol , no  recent flares Uric acid at goal - Continue allopurinol  as prescribed.  Gastroesophageal reflux disease Indigestion controlled with Protonix . - Continue Protonix  as prescribed.  Hyperglycemia Impaired fasting glucose. Due for A1c check. - Ordered A1c test.  Cough variant asthma Breathing well-controlled with Singulair  and Symbicort . - Continue Singulair  10 mg daily. - Continue Symbicort  two puffs twice daily.  Vitamin D  deficiency Vitamin D  level needs checking. On supplement. - Ordered vitamin D  level test. - Continue vitamin D  supplement.    Return in about 6 months (around 04/16/2025) for routine follow up, labs at time of visit.:  Blaike Vickers K. Traci BODILY [facility] [phone]     [1]  Allergies Allergen Reactions   Bactrim [Sulfamethoxazole-Trimethoprim]    Keflex [Cephalexin]    Nsaids Other (See Comments)    Told not to take due to kidney function   Omnicef [Cefdinir]    Sulfa Antibiotics    "

## 2024-10-17 ENCOUNTER — Ambulatory Visit: Payer: Self-pay | Admitting: Nurse Practitioner

## 2024-10-17 LAB — CBC WITH DIFFERENTIAL/PLATELET
Absolute Lymphocytes: 1639 {cells}/uL (ref 850–3900)
Absolute Monocytes: 367 {cells}/uL (ref 200–950)
Basophils Absolute: 20 {cells}/uL (ref 0–200)
Basophils Relative: 0.6 %
Eosinophils Absolute: 78 {cells}/uL (ref 15–500)
Eosinophils Relative: 2.3 %
HCT: 43.9 % (ref 35.9–46.0)
Hemoglobin: 14 g/dL (ref 11.7–15.5)
MCH: 27.3 pg (ref 27.0–33.0)
MCHC: 31.9 g/dL (ref 31.6–35.4)
MCV: 85.6 fL (ref 81.4–101.7)
MPV: 11.4 fL (ref 7.5–12.5)
Monocytes Relative: 10.8 %
Neutro Abs: 1295 {cells}/uL — ABNORMAL LOW (ref 1500–7800)
Neutrophils Relative %: 38.1 %
Platelets: 240 Thousand/uL (ref 140–400)
RBC: 5.13 Million/uL — ABNORMAL HIGH (ref 3.80–5.10)
RDW: 12.9 % (ref 11.0–15.0)
Total Lymphocyte: 48.2 %
WBC: 3.4 Thousand/uL — ABNORMAL LOW (ref 3.8–10.8)

## 2024-10-17 LAB — COMPREHENSIVE METABOLIC PANEL WITH GFR
AG Ratio: 1.3 (calc) (ref 1.0–2.5)
ALT: 13 U/L (ref 6–29)
AST: 15 U/L (ref 10–35)
Albumin: 3.9 g/dL (ref 3.6–5.1)
Alkaline phosphatase (APISO): 84 U/L (ref 37–153)
BUN: 16 mg/dL (ref 7–25)
CO2: 30 mmol/L (ref 20–32)
Calcium: 8.9 mg/dL (ref 8.6–10.4)
Chloride: 104 mmol/L (ref 98–110)
Creat: 0.99 mg/dL (ref 0.50–1.05)
Globulin: 3 g/dL (ref 1.9–3.7)
Glucose, Bld: 113 mg/dL — ABNORMAL HIGH (ref 65–99)
Potassium: 4.1 mmol/L (ref 3.5–5.3)
Sodium: 144 mmol/L (ref 135–146)
Total Bilirubin: 0.7 mg/dL (ref 0.2–1.2)
Total Protein: 6.9 g/dL (ref 6.1–8.1)
eGFR: 62 mL/min/1.73m2

## 2024-10-17 LAB — HEMOGLOBIN A1C
Hgb A1c MFr Bld: 6.1 % — ABNORMAL HIGH
Mean Plasma Glucose: 128 mg/dL
eAG (mmol/L): 7.1 mmol/L

## 2024-10-17 LAB — LIPID PANEL
Cholesterol: 160 mg/dL
HDL: 60 mg/dL
LDL Cholesterol (Calc): 82 mg/dL
Non-HDL Cholesterol (Calc): 100 mg/dL
Total CHOL/HDL Ratio: 2.7 (calc)
Triglycerides: 90 mg/dL

## 2024-10-17 LAB — URIC ACID: Uric Acid, Serum: 5 mg/dL (ref 2.5–7.0)

## 2024-10-17 LAB — VITAMIN D 25 HYDROXY (VIT D DEFICIENCY, FRACTURES): Vit D, 25-Hydroxy: 44 ng/mL (ref 30–100)

## 2024-10-20 ENCOUNTER — Ambulatory Visit: Admitting: Nurse Practitioner

## 2024-11-01 ENCOUNTER — Ambulatory Visit: Admission: EM | Admit: 2024-11-01 | Discharge: 2024-11-01 | Disposition: A | Discharge status: Home / Self Care

## 2024-11-01 ENCOUNTER — Encounter: Payer: Self-pay | Admitting: Emergency Medicine

## 2024-11-01 DIAGNOSIS — H8110 Benign paroxysmal vertigo, unspecified ear: Secondary | ICD-10-CM | POA: Diagnosis not present

## 2024-11-01 MED ORDER — MECLIZINE HCL 25 MG PO TABS
25.0000 mg | ORAL_TABLET | Freq: Once | ORAL | Status: AC
  Administered 2024-11-01: 25 mg via ORAL

## 2024-11-01 MED ORDER — MECLIZINE HCL 25 MG PO TABS: 25.0000 mg | ORAL_TABLET | Freq: Three times a day (TID) | ORAL | 0 refills | Status: AC | PRN

## 2024-11-01 NOTE — Discharge Instructions (Addendum)
 Please take meds as needed. If you experience dizziness that is associated with chest pain, shortness of breath, nausea, or vomiting we need to report to the ED.

## 2024-11-01 NOTE — ED Triage Notes (Addendum)
 Pt presents c/o dizziness and nausea x 3 days. Pt states,  I think I got vertigo. I'm just dizzy. My head was hurting but that went away. I also think my R ear is stopped up. I took some meds for it that work during the day but over night it doesn't help much.

## 2024-11-01 NOTE — ED Provider Notes (Signed)
 " [facility]    CSN: [id number] Arrival date & time: 11/01/24  1233      History   Chief Complaint Chief Complaint  Patient presents with   Nausea   Dizziness    HPI Traci Gomez is a 69 y.o. female.   Pt presents today due to 3 days of nausea, dizziness, and right ear pain. Pt states that she had nausea on day 1 that has since improved. Pt states that she started with dizziness that felt like room spinning on day 2 but was able to relieve it with use of meclizine , pt states that she used meclizine  yesterday with relief as well. Pt states that when she woke up this morning she felt like the room spinning was worse, denies use of meclizine  today. Pt has a hx of vertigo and states that this feels exactly like previous episodes of vertigo.   The history is provided by the patient.  Dizziness   Past Medical History:  Diagnosis Date   Allergic rhinitis, unspecified    Arthritis    Per [facility] new patient packet    Asthma    Per [facility] new patient packet    Gout, unspecified    Hyperglycemia    Per labs completed on 03/15/2019 @ [facility]   Hyperlipidemia    Hypertension    Irregular bowel habits    Per [facility] new patient packet    Right knee injury    Stretched veins in right knee    Upper respiratory infection    Varicose vein of leg    Right, Per [facility] new patient packet    Vitamin D  deficiency    Yeast dermatitis     Patient Active Problem List   Diagnosis Date Noted   Lower extremity edema 04/19/2024   Primary osteoarthritis of right knee 04/19/2024   Obesity, morbid (more than 100 lbs over ideal weight or BMI > 40) (HCC) 06/03/2019   Vitamin D  deficiency    Gout, unspecified    Hyperglycemia    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      Home  Medications    Prior to Admission medications  Medication Sig Start Date End Date Taking? Authorizing Provider  meclizine  (ANTIVERT ) 25 MG tablet Take 1 tablet (25 mg total) by mouth 3 (three) times daily as needed for dizziness. 11/01/24  Yes Andra Corean BROCKS, PA-C  acetaminophen (TYLENOL) 500 MG tablet Take 500 mg by mouth as needed.    [provider]  albuterol  (VENTOLIN  HFA) 108 (90 Base) MCG/ACT inhaler INHALE 2 PUFFS BY MOUTH EVERY 6 HOURS AS NEEDED FOR WHEEZING OR SHORTNESS OF BREATH 06/11/22   [name], NP  Albuterol  Sulfate 2.5 MG/0.5ML NEBU Inhale 0.5 mLs (2.5 mg total) into the lungs as needed. 04/16/20   [name], NP  allopurinol  (ZYLOPRIM ) 100 MG tablet TAKE 1 TABLET(100 MG) BY MOUTH DAILY 08/25/24   Eubanks, Jessica K, NP  amLODipine  (NORVASC ) 5 MG tablet TAKE 1 TABLET(5 MG) BY MOUTH DAILY 09/28/24   Eubanks, Jessica K, NP  budesonide -formoterol  (SYMBICORT ) 80-4.5 MCG/ACT inhaler INHALE 2 PUFFS INTO THE LUNGS TWICE DAILY 02/15/24   Eubanks, Jessica K, NP  Cholecalciferol  (VITAMIN D3 PO) Take  1 capsule by mouth daily.    [provider]  famotidine  (PEPCID ) 20 MG tablet TAKE 1 TABLET BY MOUTH EVERY DAY AFTER SUPPER 05/22/24   Eubanks, Jessica K, NP  furosemide  (LASIX ) 40 MG tablet TAKE 1 AND 1/2 TABLETS BY MOUTH DAILY 01/03/24   Eubanks, Jessica K, NP  metoprolol  succinate (TOPROL -XL) 25 MG 24 hr tablet TAKE 1 TABLET(25 MG) BY MOUTH DAILY 01/03/24   Eubanks, Jessica K, NP  montelukast  (SINGULAIR ) 10 MG tablet TAKE 1 TABLET(10 MG) BY MOUTH AT BEDTIME 05/22/24   Eubanks, Jessica K, NP  nystatin  (MYCOSTATIN /NYSTOP ) powder Apply topically as needed. 05/08/24   Eubanks, Jessica K, NP  nystatin  cream (MYCOSTATIN ) Apply to skin twice daily as needed yeast 05/31/23   Eubanks, Jessica K, NP  olmesartan  (BENICAR ) 20 MG tablet TAKE 1 TABLET(20 MG) BY MOUTH DAILY 01/03/24   Eubanks, Jessica K, NP  ondansetron  (ZOFRAN -ODT) 4 MG disintegrating tablet Take 1 tablet (4  mg total) by mouth every 8 (eight) hours as needed. 06/20/23   Billy Asberry FALCON, PA-C  pantoprazole  (PROTONIX ) 40 MG tablet TAKE 1 TABLET(40 MG) BY MOUTH DAILY 30 TO 60 MINUTES BEFORE FIRST MEAL OF THE DAY 06/01/24   [name], NP  potassium chloride  (MICRO-K ) 10 MEQ CR capsule TAKE 1 CAPSULE(10 MEQ) BY MOUTH DAILY 02/21/24   [name], NP    Family History Family History  Problem Relation Age of Onset   High blood pressure Mother    Hypertension Mother    Heart disease Father    COPD Father    Heart disease Brother     Social History Social History[1]   Allergies   Bactrim [sulfamethoxazole-trimethoprim], Keflex [cephalexin], Nsaids, Omnicef [cefdinir], and Sulfa antibiotics   Review of Systems Review of Systems  Neurological:  Positive for dizziness.     Physical Exam Triage Vital Signs ED Triage Vitals  Encounter Vitals Group     BP 11/01/24 1350 (!) 172/94     Girls Systolic BP Percentile --      Girls Diastolic BP Percentile --      Boys Systolic BP Percentile --      Boys Diastolic BP Percentile --      Pulse Rate 11/01/24 1350 63     Resp 11/01/24 1350 18     Temp 11/01/24 1350 97.8 F (36.6 C)     Temp Source 11/01/24 1350 Oral     SpO2 11/01/24 1350 97 %     Weight 11/01/24 1350 (!) 317 lb 0.3 oz (143.8 kg)     Height --      Head Circumference --      Peak Flow --      Pain Score 11/01/24 1349 0     Pain Loc --      Pain Education --      Exclude from Growth Chart --    No data found.  Updated Vital Signs BP (!) 172/94 (BP Location: Left Arm)   Pulse 63   Temp 97.8 F (36.6 C) (Oral)   Resp 18   Wt (!) 317 lb 0.3 oz (143.8 kg)   SpO2 97%   BMI 46.82 kg/m   Visual Acuity Right Eye Distance:   Left Eye Distance:   Bilateral Distance:    Right Eye Near:   Left Eye Near:    Bilateral Near:     Physical Exam Vitals and nursing note reviewed.  Constitutional:      General: She is not in  acute distress.    Appearance:  Normal appearance. She is not ill-appearing, toxic-appearing or diaphoretic.  HENT:     Right Ear: Tympanic membrane, ear canal and external ear normal.     Left Ear: Tympanic membrane, ear canal and external ear normal.     Nose: Congestion (moderately enlarged turbinates) present. No rhinorrhea.  Eyes:     General: No scleral icterus. Cardiovascular:     Rate and Rhythm: Normal rate and regular rhythm.     Heart sounds: Normal heart sounds.  Pulmonary:     Effort: Pulmonary effort is normal. No respiratory distress.     Breath sounds: Normal breath sounds. No wheezing or rhonchi.  Skin:    General: Skin is warm.  Neurological:     Mental Status: She is alert and oriented to person, place, and time.     Comments: Dizziness exacerbated when patient was switched from sitting upright to supine and then back to sitting upright.   Psychiatric:        Mood and Affect: Mood normal.        Behavior: Behavior normal.      UC Treatments / Results  Labs (all labs ordered are listed, but only abnormal results are displayed) Labs Reviewed - No data to display  EKG   Radiology No results found.  Procedures Procedures (including critical care time)  Medications Ordered in UC Medications  meclizine  (ANTIVERT ) tablet 25 mg (25 mg Oral Given 11/01/24 1409)    Initial Impression / Assessment and Plan / UC Course  I have reviewed the triage vital signs and the nursing notes.  Pertinent labs & imaging results that were available during my care of the patient were reviewed by me and considered in my medical decision making (see chart for details).      Final Clinical Impressions(s) / UC Diagnoses   Final diagnoses:  Benign paroxysmal positional vertigo, unspecified laterality     Discharge Instructions      Please take meds as needed. If you experience dizziness that is associated with chest pain, shortness of breath, nausea, or vomiting we need to report to the  ED.     ED Prescriptions     Medication Sig Dispense Auth. Provider   meclizine  (ANTIVERT ) 25 MG tablet Take 1 tablet (25 mg total) by mouth 3 (three) times daily as needed for dizziness. 30 tablet Andra Corean BROCKS, PA-C      PDMP not reviewed this encounter.     [1]  Social History Tobacco Use   Smoking status: Never    Passive exposure: Never   Smokeless tobacco: Never  Vaping Use   Vaping status: Never Used  Substance Use Topics   Alcohol use: Not Currently   Drug use: Not Currently     Andra Corean BROCKS DEVONNA 11/01/24 1827  "

## 2024-11-08 ENCOUNTER — Encounter: Payer: Self-pay | Admitting: Nurse Practitioner

## 2024-11-20 ENCOUNTER — Ambulatory Visit: Admitting: Nurse Practitioner

## 2024-11-21 ENCOUNTER — Other Ambulatory Visit: Payer: Self-pay | Admitting: Nurse Practitioner

## 2024-11-21 DIAGNOSIS — J45991 Cough variant asthma: Secondary | ICD-10-CM

## 2024-11-22 ENCOUNTER — Encounter: Payer: Self-pay | Admitting: Nurse Practitioner

## 2024-11-24 ENCOUNTER — Other Ambulatory Visit: Payer: Self-pay | Admitting: Nurse Practitioner

## 2024-11-24 DIAGNOSIS — J45991 Cough variant asthma: Secondary | ICD-10-CM

## 2024-11-28 ENCOUNTER — Ambulatory Visit: Admitting: Nurse Practitioner

## 2024-12-18 ENCOUNTER — Ambulatory Visit: Admitting: Nurse Practitioner

## 2025-04-16 ENCOUNTER — Ambulatory Visit: Admitting: Nurse Practitioner
